# Patient Record
Sex: Female | Born: 1950 | Race: Black or African American | Hispanic: No | Marital: Single | Smoking: Never smoker
Health system: Southern US, Community
[De-identification: ages and names within clinical notes are randomized; demographics above are authoritative.]

## PROBLEM LIST (undated history)

## (undated) DIAGNOSIS — K649 Unspecified hemorrhoids: Secondary | ICD-10-CM

## (undated) DIAGNOSIS — E119 Type 2 diabetes mellitus without complications: Secondary | ICD-10-CM

## (undated) DIAGNOSIS — I10 Essential (primary) hypertension: Secondary | ICD-10-CM

## (undated) DIAGNOSIS — D649 Anemia, unspecified: Secondary | ICD-10-CM

## (undated) DIAGNOSIS — E7800 Pure hypercholesterolemia, unspecified: Secondary | ICD-10-CM

## (undated) DIAGNOSIS — N920 Excessive and frequent menstruation with regular cycle: Secondary | ICD-10-CM

## (undated) HISTORY — DX: Anemia, unspecified: D64.9

## (undated) HISTORY — DX: Excessive and frequent menstruation with regular cycle: N92.0

## (undated) HISTORY — DX: Unspecified hemorrhoids: K64.9

## (undated) HISTORY — DX: Pure hypercholesterolemia, unspecified: E78.00

## (undated) HISTORY — DX: Essential (primary) hypertension: I10

## (undated) HISTORY — DX: Type 2 diabetes mellitus without complications: E11.9

---

## 1992-06-29 HISTORY — PX: ECTOPIC PREGNANCY SURGERY: SHX613

## 1999-03-12 ENCOUNTER — Ambulatory Visit (HOSPITAL_COMMUNITY): Admission: RE | Admit: 1999-03-12 | Discharge: 1999-03-12 | Payer: Self-pay | Admitting: Internal Medicine

## 1999-03-12 ENCOUNTER — Encounter: Payer: Self-pay | Admitting: Internal Medicine

## 1999-08-05 ENCOUNTER — Encounter: Admission: RE | Admit: 1999-08-05 | Discharge: 1999-08-05 | Payer: Self-pay | Admitting: *Deleted

## 1999-08-05 ENCOUNTER — Encounter: Payer: Self-pay | Admitting: *Deleted

## 2001-01-25 ENCOUNTER — Encounter: Payer: Self-pay | Admitting: Internal Medicine

## 2001-01-25 ENCOUNTER — Encounter: Admission: RE | Admit: 2001-01-25 | Discharge: 2001-01-25 | Payer: Self-pay | Admitting: Internal Medicine

## 2002-03-09 ENCOUNTER — Encounter: Payer: Self-pay | Admitting: Internal Medicine

## 2002-03-09 ENCOUNTER — Encounter: Admission: RE | Admit: 2002-03-09 | Discharge: 2002-03-09 | Payer: Self-pay | Admitting: Internal Medicine

## 2002-08-22 ENCOUNTER — Other Ambulatory Visit: Admission: RE | Admit: 2002-08-22 | Discharge: 2002-08-22 | Payer: Self-pay | Admitting: Obstetrics and Gynecology

## 2003-06-07 ENCOUNTER — Encounter: Admission: RE | Admit: 2003-06-07 | Discharge: 2003-06-07 | Payer: Self-pay | Admitting: Internal Medicine

## 2003-11-07 ENCOUNTER — Other Ambulatory Visit: Admission: RE | Admit: 2003-11-07 | Discharge: 2003-11-07 | Payer: Self-pay | Admitting: Obstetrics and Gynecology

## 2003-12-20 ENCOUNTER — Encounter (INDEPENDENT_AMBULATORY_CARE_PROVIDER_SITE_OTHER): Payer: Self-pay | Admitting: *Deleted

## 2003-12-20 ENCOUNTER — Encounter (INDEPENDENT_AMBULATORY_CARE_PROVIDER_SITE_OTHER): Payer: Self-pay | Admitting: Specialist

## 2003-12-20 ENCOUNTER — Ambulatory Visit (HOSPITAL_COMMUNITY): Admission: RE | Admit: 2003-12-20 | Discharge: 2003-12-20 | Payer: Self-pay | Admitting: Gastroenterology

## 2004-09-17 ENCOUNTER — Encounter: Admission: RE | Admit: 2004-09-17 | Discharge: 2004-09-17 | Payer: Self-pay | Admitting: Internal Medicine

## 2004-10-22 ENCOUNTER — Encounter: Admission: RE | Admit: 2004-10-22 | Discharge: 2004-10-22 | Payer: Self-pay | Admitting: Internal Medicine

## 2005-06-17 ENCOUNTER — Other Ambulatory Visit: Admission: RE | Admit: 2005-06-17 | Discharge: 2005-06-17 | Payer: Self-pay | Admitting: Obstetrics and Gynecology

## 2005-11-20 ENCOUNTER — Encounter: Admission: RE | Admit: 2005-11-20 | Discharge: 2005-11-20 | Payer: Self-pay | Admitting: Internal Medicine

## 2006-04-29 HISTORY — PX: ENDOMETRIAL BIOPSY: SHX622

## 2006-06-29 DIAGNOSIS — N920 Excessive and frequent menstruation with regular cycle: Secondary | ICD-10-CM

## 2006-06-29 HISTORY — DX: Excessive and frequent menstruation with regular cycle: N92.0

## 2006-10-13 ENCOUNTER — Encounter: Admission: RE | Admit: 2006-10-13 | Discharge: 2007-01-11 | Payer: Self-pay | Admitting: Obstetrics and Gynecology

## 2006-11-26 ENCOUNTER — Other Ambulatory Visit: Admission: RE | Admit: 2006-11-26 | Discharge: 2006-11-26 | Payer: Self-pay | Admitting: Obstetrics and Gynecology

## 2006-12-30 ENCOUNTER — Encounter: Admission: RE | Admit: 2006-12-30 | Discharge: 2006-12-30 | Payer: Self-pay | Admitting: Obstetrics and Gynecology

## 2007-12-02 ENCOUNTER — Other Ambulatory Visit: Admission: RE | Admit: 2007-12-02 | Discharge: 2007-12-02 | Payer: Self-pay | Admitting: Obstetrics and Gynecology

## 2008-01-02 ENCOUNTER — Encounter: Admission: RE | Admit: 2008-01-02 | Discharge: 2008-01-02 | Payer: Self-pay | Admitting: Obstetrics and Gynecology

## 2008-11-02 ENCOUNTER — Observation Stay (HOSPITAL_COMMUNITY): Admission: EM | Admit: 2008-11-02 | Discharge: 2008-11-05 | Payer: Self-pay | Admitting: Emergency Medicine

## 2008-11-05 ENCOUNTER — Encounter (INDEPENDENT_AMBULATORY_CARE_PROVIDER_SITE_OTHER): Payer: Self-pay | Admitting: Internal Medicine

## 2009-01-02 ENCOUNTER — Encounter: Admission: RE | Admit: 2009-01-02 | Discharge: 2009-01-02 | Payer: Self-pay | Admitting: Obstetrics and Gynecology

## 2009-03-26 DIAGNOSIS — Z860100 Personal history of colon polyps, unspecified: Secondary | ICD-10-CM | POA: Insufficient documentation

## 2009-03-26 DIAGNOSIS — I10 Essential (primary) hypertension: Secondary | ICD-10-CM | POA: Insufficient documentation

## 2009-03-26 DIAGNOSIS — E785 Hyperlipidemia, unspecified: Secondary | ICD-10-CM

## 2009-03-26 DIAGNOSIS — Z862 Personal history of diseases of the blood and blood-forming organs and certain disorders involving the immune mechanism: Secondary | ICD-10-CM

## 2009-07-10 ENCOUNTER — Telehealth: Payer: Self-pay | Admitting: Internal Medicine

## 2009-07-10 ENCOUNTER — Encounter: Payer: Self-pay | Admitting: Internal Medicine

## 2009-08-07 ENCOUNTER — Ambulatory Visit: Payer: Self-pay | Admitting: Internal Medicine

## 2009-08-07 DIAGNOSIS — K648 Other hemorrhoids: Secondary | ICD-10-CM | POA: Insufficient documentation

## 2010-01-07 ENCOUNTER — Encounter: Admission: RE | Admit: 2010-01-07 | Discharge: 2010-01-07 | Payer: Self-pay | Admitting: Obstetrics and Gynecology

## 2010-07-29 NOTE — Assessment & Plan Note (Signed)
Summary: RECTAL PAIN/ITCHING            [name]   History of Present Illness Visit Type: Initial Visit Primary GI MD: Lina Sar MD Primary Provider: Knox Royalty, MD Chief Complaint: Hemorrhoid flare in Jan. 2011, not irritated  now History of Present Illness:   This is a 60 year old African American female with symptomatic hemorrhoids. She complains of rectal pain and irritation. The last episode was several weeks ago and a previous episode occurred in November 2010. She denies rectal bleeding or any prolapse. Problems started last year. She is quite overweight but denies any change in bowel habits. She denies constipation or diarrhea. Other medical problems include high blood pressure and hyperlipidemia.  Colonoscopy and upper endoscopy was  in June 2005 by Dr.Ganem  showed a small hyperplastic polyp but was an otherwise normal exam. She was at that time evaluated for iron deficiency anemia.   GI Review of Systems      Denies abdominal pain, acid reflux, belching, bloating, chest pain, dysphagia with liquids, dysphagia with solids, heartburn, loss of appetite, nausea, vomiting, vomiting blood, weight loss, and  weight gain.      Reports hemorrhoids and  rectal pain.     Denies anal fissure, black tarry stools, change in bowel habit, constipation, diarrhea, diverticulosis, fecal incontinence, heme positive stool, irritable bowel syndrome, jaundice, light color stool, liver problems, and  rectal bleeding. Preventive Screening-Counseling & Management  Alcohol-Tobacco     Smoking Status: never    Current Medications (verified): 1)  Atenolol 100 Mg Tabs (Atenolol) .... Once Daily 2)  Micardis Hct 80-25 Mg Tabs (Telmisartan-Hctz) .... Once Daily 3)  Simvastatin 20 Mg Tabs (Simvastatin) .... Once Daily 4)  Lovaza 1 Gm Caps (Omega-3-Acid Ethyl Esters) .... Take 4 Capsules Daily 5)  Aspir-Low 81 Mg Tbec (Aspirin) .... Once Daily  Allergies (verified): No Known Drug Allergies  Past  History:  Past Medical History: Current Problems:  DYSLIPIDEMIA (ICD-272.4) HYPERTENSION (ICD-401.9) IRON DEFICIENCY ANEMIA, HX OF (ICD-V12.3) COLONIC POLYPS, HYPERPLASTIC, HX OF (ICD-V12.72)   Obesity  Past Surgical History: Etopic pregnancy 1994  Family History: Reviewed history from 03/26/2009 and no changes required. No FH of Colon Cancer:  Social History: Illicit Drug Use - no Occupation: Retired Patient has never smoked.  Alcohol Use - no Daily Caffeine Use Smoking Status:  never  Review of Systems  The patient denies allergy/sinus, anemia, anxiety-new, arthritis/joint pain, back pain, blood in urine, breast changes/lumps, change in vision, confusion, cough, coughing up blood, depression-new, fainting, fatigue, fever, headaches-new, hearing problems, heart murmur, heart rhythm changes, itching, menstrual pain, muscle pains/cramps, night sweats, nosebleeds, pregnancy symptoms, shortness of breath, skin rash, sleeping problems, sore throat, swelling of feet/legs, swollen lymph glands, thirst - excessive , urination - excessive , urination changes/pain, urine leakage, vision changes, and voice change.         Pertinent positive and negative review of systems were noted in the above HPI. All other ROS was otherwise negative.   Vital Signs:  Patient profile:   60 year old female Height:      61 inches Weight:      250.50 pounds BMI:     47.50 Pulse rate:   72 / minute Pulse rhythm:   regular BP sitting:   110 / 68  (left arm) Cuff size:   regular  Vitals Entered By: June McMurray CMA Duncan Dull) (August 07, 2009 9:11 AM)  Physical Exam  General:  alert, oriented, overweight. Neck:  Supple;  no masses or thyromegaly. Lungs:  Clear throughout to auscultation. Heart:  Regular rate and rhythm; no murmurs, rubs,  or bruits. Abdomen:  obese soft abdomen, nontender. Normoactive bowel sounds. No distention. Rectal:  rectal and anoscopic exam reveals normal perianal area, no  prolapsed tissue. Normal rectal sphincter. First grade internal hemorrhoids with hyperemia and tiny fissures on top of.  No active bleeding. Mucosa appears normal; no evidence of colitis. There is no evidence of thrombosis. Stool is Hemoccult negative. Extremities:  No clubbing, cyanosis, edema or deformities noted. Skin:  Intact without significant lesions or rashes. Psych:  Alert and cooperative. Normal mood and affect.   Impression & Recommendations:  Problem # 1:  HEMORRHOIDS, INTERNAL (ICD-455.0) Patient has symptomatic first-degree internal hemorrhoids. There is no prolapse or other complications. The hems are only  mildly active  on today's exam. Patient has episodic flareups. Because of  small  size  I would not recommend surgical intervention. We have discussed at length  a high-fiber diet and  topical steroids as well as Anusol-HC suppositories. I advised her to take the suppositories as a maintenance maybe once a week and use the Analpram cream preventively about once a week. If the flareups continue, I would suggest band ligation of the internal hemorrhoids. She suggested PPH treatment but I do not feel that the hemorrhoids are not large enough for this. She also saw an advertisment in the newspaper by [facility] for treatment of hemorrhoids. I encouraged her to find out the nature of the treatment.  Problem # 2:  COLONIC POLYPS, HYPERPLASTIC, HX OF (ICD-V12.72) Patient's last colonoscopy was in 2005. Her next colonoscopy will be due in June 2015.  Patient Instructions: 1)  Analpram cream 2.5% apply 3 times a day p.r.n. flareup hemorrhoids and as a maintenance once a week. 2)  Anusol-HC suppositories q.h.s. for flareup and for maintenance once a week. 3)  High-fiber diet. 4)  Information booklet on hemorrhoids. 5)  Return in one year to reassess. 6)  Recall colonoscopy June 2015. 7)  Copy sent to : Dr Drucilla Chalet 8)  The medication list was reviewed and reconciled.  All changed / newly  prescribed medications were explained.  A complete medication list was provided to the patient / caregiver. Prescriptions: ANALPRAM-HC 1-2.5 % CREA (HYDROCORTISONE ACE-PRAMOXINE) Apply to rectum 3 times daily as needed for hemorrhoids  #30 grams x 3   Entered by:   Hortense Ramal CMA (AAMA)   Authorized by:   Hart Carwin MD   Signed by:   Hortense Ramal CMA (AAMA) on 08/07/2009   Method used:   Electronically to        [id number] (retail)       [address]       [place], [place]  [address]       Ph: [phone] or [phone]       Fax: [phone]   RxID:   [id number] ANUSOL-HC 25 MG SUPP (HYDROCORTISONE ACETATE) Insert 1 suppository into rectum at bedtime and as needed for flareups  #12 x 3   Entered by:   Hortense Ramal CMA (AAMA)   Authorized by:   Hart Carwin MD   Signed by:   Hortense Ramal CMA (AAMA) on 08/07/2009   Method used:   Electronically to        [id number] (retail)       [address]       [place], [place]  [address]  Ph: [phone] or [phone]       Fax: [phone]   RxID:   [id number]

## 2010-07-29 NOTE — Letter (Signed)
Summary: New Patient letter  [facility]  [address] [place], [place] [address]   Phone: [phone]  Fax: [phone]       07/10/2009 MRN: [id number]  [facility] [address] [place], [place]  [address]  Dear Ms. Riemenschneider,  Welcome to the Gastroenterology Division at [facility]    You are scheduled to see Dr. Lina Sar on 08-07-09 at 9am, on the 3rd floor at [facility], [address]  We ask that you try to arrive at our office 15 minutes prior to your appointment time to allow for check-in.  We would like you to complete the enclosed self-administered evaluation form prior to your visit and bring it with you on the day of your appointment.  We will review it with you.  Also, please bring a complete list of all your medications or, if you prefer, bring the medication bottles and we will list them.  Please bring your insurance card so that we may make a copy of it.  If your insurance requires a referral to see a specialist, please bring your referral form from your primary care physician.  Co-payments are due at the time of your visit and may be paid by cash, check or credit card.     Your office visit will consist of a consult with your physician (includes a physical exam), any laboratory testing he/she may order, scheduling of any necessary diagnostic testing (e.g. x-ray, ultrasound, CT-scan), and scheduling of a procedure (e.g. Endoscopy, Colonoscopy) if required.  Please allow enough time on your schedule to allow for any/all of these possibilities.    If you cannot keep your appointment, please call [phone] to cancel or reschedule prior to your appointment date.  This allows [place] the opportunity to schedule an appointment for another patient in need of care.  If you do not cancel or reschedule by 5 p.m. the business day prior to your appointment date, you will be charged a $50.00 late cancellation/no-show fee.    Thank you for choosing  [facility] for your medical needs.  We appreciate the opportunity to care for you.  Please visit [place] at our website  to learn more about our practice.                     Sincerely,                                                             The Gastroenterology Division   Appended Document: New Patient letter Letter mailed to patient.

## 2010-07-29 NOTE — Progress Notes (Signed)
Summary: triage  Phone Note Call from Patient Call back at Work Phone [phone]   Caller: Patient Call For: Dr. Juanda Chance Reason for Call: Talk to Nurse Summary of Call: pt called to sch appt for hemorrhoids.Marland Kitchenonly wants to see a female. Initial call taken by: Vallarie Mare,  July 10, 2009 11:52 AM  Follow-up for Phone Call        Pt. has GI  history w/Dr.Ganim in 2005. Pt. wants to establish with  a female MD.  Pt. c/o hemorrhoids, she was seen by her PCP and was given some cream, pt. continues w/episodes of rectal pain/itching. Denies blood. 1) See Dr.Inara Dike on 08-07-09 at 9am 2) Continue treatment with PCP until then. Follow-up by: Laureen Ochs LPN,  July 10, 2009 12:07 PM

## 2010-10-07 LAB — COMPREHENSIVE METABOLIC PANEL
ALT: 15 U/L (ref 0–35)
AST: 19 U/L (ref 0–37)
Albumin: 3.2 g/dL — ABNORMAL LOW (ref 3.5–5.2)
Alkaline Phosphatase: 65 U/L (ref 39–117)
Alkaline Phosphatase: 80 U/L (ref 39–117)
BUN: 21 mg/dL (ref 6–23)
CO2: 26 mEq/L (ref 19–32)
Calcium: 9.6 mg/dL (ref 8.4–10.5)
Calcium: 9.7 mg/dL (ref 8.4–10.5)
Creatinine, Ser: 1.03 mg/dL (ref 0.4–1.2)
GFR calc Af Amer: >60 mL/min (ref >60)
GFR calc non Af Amer: 56 mL/min — ABNORMAL LOW (ref >60)
Glucose, Bld: 120 mg/dL — ABNORMAL HIGH (ref 70–99)
Glucose, Bld: 92 mg/dL (ref 70–99)
Potassium: 3.7 mEq/L (ref 3.5–5.1)
Sodium: 138 mEq/L (ref 135–145)
Total Protein: 6.6 g/dL (ref 6.0–8.3)

## 2010-10-07 LAB — CULTURE, BLOOD (ROUTINE X 2)
Culture: NO GROWTH
Culture: NO GROWTH

## 2010-10-07 LAB — CBC
Hemoglobin: 12.3 g/dL (ref 12.0–15.0)
MCHC: 33 g/dL (ref 30.0–36.0)
MCHC: 33.2 g/dL (ref 30.0–36.0)
MCV: 87.1 fL (ref 78.0–100.0)
Platelets: 261 10*3/uL (ref 150–400)
Platelets: 277 10*3/uL (ref 150–400)
RBC: 4.68 MIL/uL (ref 3.87–5.11)
RDW: 14.8 % (ref 11.5–15.5)
RDW: 15 % (ref 11.5–15.5)
WBC: 8.4 10*3/uL (ref 4.0–10.5)

## 2010-10-07 LAB — PROTIME-INR: Prothrombin Time: 14 seconds (ref 11.6–15.2)

## 2010-10-07 LAB — APTT: aPTT: 27 seconds (ref 24–37)

## 2010-10-07 LAB — URINE CULTURE

## 2010-10-07 LAB — LIPID PANEL
Cholesterol: 168 mg/dL (ref 0–200)
HDL: 38 mg/dL — ABNORMAL LOW (ref >39)
Total CHOL/HDL Ratio: 4.4 RATIO
VLDL: 17 mg/dL (ref 0–40)

## 2010-10-07 LAB — DIFFERENTIAL
Basophils Absolute: 0.1 10*3/uL (ref 0.0–0.1)
Basophils Relative: 1 % (ref 0–1)
Eosinophils Absolute: 0 10*3/uL (ref 0.0–0.7)
Lymphocytes Relative: 19 % (ref 12–46)
Lymphs Abs: 1.6 10*3/uL (ref 0.7–4.0)
Monocytes Absolute: 0.5 10*3/uL (ref 0.1–1.0)

## 2010-10-07 LAB — CK TOTAL AND CKMB (NOT AT ARMC): Total CK: 259 U/L — ABNORMAL HIGH (ref 7–177)

## 2010-10-07 LAB — URINALYSIS, ROUTINE W REFLEX MICROSCOPIC
Glucose, UA: NEGATIVE mg/dL
Ketones, ur: NEGATIVE mg/dL
Urobilinogen, UA: 1 mg/dL (ref 0.0–1.0)

## 2010-10-07 LAB — POCT CARDIAC MARKERS: CKMB, poc: 2 ng/mL (ref 1.0–8.0)

## 2010-10-07 LAB — URINE MICROSCOPIC-ADD ON

## 2010-10-07 LAB — CARDIAC PANEL(CRET KIN+CKTOT+MB+TROPI)
CK, MB: 2.3 ng/mL (ref 0.3–4.0)
Total CK: 186 U/L — ABNORMAL HIGH (ref 7–177)

## 2010-10-07 LAB — HEMOGLOBIN A1C: Mean Plasma Glucose: 143 mg/dL

## 2010-11-11 NOTE — H&P (Signed)
NAMECHARICE, [name]               ACCOUNT NO.:  [id number]   MEDICAL RECORD NO.:  [id number]          PATIENT TYPE:  EMS   LOCATION:  MAJO                         FACILITY:  [facility]   PHYSICIAN:  Richarda Overlie, MD       DATE OF BIRTH:  [birth year]   DATE OF ADMISSION:  11/01/2008  DATE OF DISCHARGE:                              HISTORY & PHYSICAL   SUBJECTIVE:  This is a 60 year old female who presents to the ER after  an episode of sweating, diaphoresis, chest tightness and one episode of  vomiting that started around 4 p.m. yesterday evening.  The patient's  symptoms of fairly acute onset lasted for about 5 minutes.  She  subsequently presented to [facility] at 7 p.m. where she was told  to go to the ER for further workup.  In the ER the patient's EKG was  essentially negative and the patient, because of her risk factor of high  blood pressure and high cholesterol, was sent to the [facility] ER for  further evaluation.  By the time she reached the ER, she was chest pain  free and the EKG was negative.  She had a couple of troponins that were  negative.  She is being admitted as per patient request for further  cardiac workup.   PAST MEDICAL HISTORY:  1. Hypertension.  2. Dyslipidemia.   SOCIAL HISTORY:  Nonsmoker, nonalcoholic, no drug use.   FAMILY HISTORY:  Family history positive for hypertension in the mother  and CVA in the grandmother.   HOME MEDICATIONS:  Atenolol, Lovasa, Cardizem was started, dosages  unknown.   REVIEW OF SYSTEMS:  Ten-point review of systems was found to be negative  for fever, chills and rigors, cough, palpitations, orthopnea, paroxysmal  nocturnal dyspnea, peripheral edema, urinary urgency, frequency,  dysuria, diarrhea, constipation, blood in stool, black tarry stool, mild  degree of arthralgias or skin rash, headache, blurry vision, unilateral  weakness, dysarthria or slurred speech.   PHYSICAL EXAMINATION:  VITAL SIGNS:  Blood pressure  113/74,  pulse 71,  respirations 13, temperature 98.2.  GENERAL:  The patient appears to be comfortable, in no acute distress.  HEENT:  Pupils equal and reactive.  Extraocular movements intact.  NECK:  Supple without any JVD, respirations.  CARDIOVASCULAR:  Regular.  LUNGS:  Clear to auscultation bilaterally.  No wheezes or crackles or  rhonchi.  ABDOMEN:  Soft, nontender, nondistended.  EXTREMITIES:  Without cyanosis, clubbing or edema.  NEUROLOGIC:  Cranial nerves II-XII grossly intact.  PSYCHIATRY:  Appropriate mood and affect.   LABORATORY DATA:  Her EKG shows normal sinus rhythm with 65 beats per  minute.  Urinalysis negative.  Troponin less than 0.05, INR 1.1.  Sodium  137, potassium 3, chloride 101, bicarb 26, glucose 120, BUN 21,  creatinine 1.03, calcium 9.6, albumin 3.6, AST 23, ALT 17, T bili of  0.7.  WBC 8.4, hemoglobin 13.5, was 40.7, MCV 87.1, platelets 277,000.  Chest x-ray:  Low lung volumes with minimal bibasilar atelectasis.   ASSESSMENT/PLAN:  Patient has 2 cardiovascular risk factors,  hypertension and dyslipidemia, and  is also morbidly obese.  The  patient's symptoms could have been consistent with angina, but she does  not present with any history of unstable angina.  No previous history of  coronary artery disease.  She had a previous stress test in 2003 that  was negative.  The patient is quite anxious to go back home and is  requesting further cardiac workup.  Cardiology consultation will be  obtained in the morning to see if the patient is a candidate for stress  test versus an invasive cardiac work-up.  Will obtain a fasting lipid  panel in the morning.  Continue her atenolol.  Also start her on low-  dose aspirin at 81 mg p.o. daily.  Lovenox for DVT prophylaxis, n.p.o.  past midnight pending cardiology evaluation.  She is a full code and  should be monitored on telemetry unit.      Richarda Overlie, MD  Electronically Signed     NA/MEDQ  D:   11/02/2008  T:  11/02/2008  Job:  [id number]

## 2010-11-11 NOTE — Discharge Summary (Signed)
[name], [name]               ACCOUNT NO.:  [id number]   MEDICAL RECORD NO.:  [id number]           PATIENT TYPE:   LOCATION:                                 FACILITY:   PHYSICIAN:  Beckey Rutter, MD  DATE OF BIRTH:  10/06/1950   DATE OF ADMISSION:  DATE OF DISCHARGE:                               DISCHARGE SUMMARY   CHIEF COMPLAINTS:  Chest pain.   HOSPITAL PROCEDURES:  1. Chest x-ray on Nov 01, 2008.  Impression is no acute cardiopulmonary      disease.  2. Low lung volumes with minimal bibasilar atelectasis.   LABORATORY TEST:  Urine culture growing more than 100,000 colonies of  multiple organisms suggesting inappropriate collection.  A1c 6.6 and TSH  is 2.293.  Blood cultures are negative.  Creatinine is 0.91.  White  blood count is 6.9, hemoglobin is 12.3, hematocrit 37.1, platelet count  is 261.   HOSPITAL COURSE:  1. Chest pain:  The patient was ruled out for acute coronary syndrome      by serial cardiac enzymes and serial EKG tracings.  The patient had      2-D echo done.  The result is still pending.  Nevertheless, the      patient feels much better and she does not want to wait for the      result of the echo.  I had a lengthy discussion with her in regards      to the result of the echo, and she wanted to come and collect the      result herself, which is agreeable to me.  Hence, clinically she is      stable for discharge.  2. Slight elevation in A1c, which could indicate diabetes type 2:  The      patient wanted to follow up with her primary physician for further      assessment.  3. Obesity, likely resulting in hyperglycemia as mentioned above.  The      patient was counseled.  4. I referred [name] to [facility].  The patient had a stress      test 8 years ago, which was negative as per her.  The phone number      was given, and she will follow up for further cardiac      stratification.  The patient might benefit from monitoring of her  glucose and possible biguanides medication.  I would leave these      medications and diagnosis of diabetes for her primary physician.   DISCHARGE DIAGNOSES:  1. Hypertension.  2. Dyslipidemia.  3. Urinary tract infection.  4. Obesity.  5. 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   DISCHARGE MEDICATIONS:  1. Atenolol 50 mg p.o. daily.  2. Lovaza 4 g p.o. daily.  3. Micardis daily dose.  4. Zocor 20 mg daily.  5. Aspirin 81 mg daily.  6. Amoxicillin 500 mg p.o. t.i.d. for 3 more days.   The patient is stable for discharge.  She does not want to wait for 2-D  echo.  Nevertheless, the patient will be  referred to Cardiology as  discussed above.  The patient wanted to follow up with her primary  physician for further management of her hypertension and borderline  diabetes.  She is aware and agreeable to discharge plan.      Beckey Rutter, MD  Electronically Signed     EME/MEDQ  D:  11/05/2008  T:  11/06/2008  Job:  [id number]

## 2010-11-14 NOTE — Op Note (Signed)
[name], [name]                           ACCOUNT NO.:  [id number]   MEDICAL RECORD NO.:  [id number]                   PATIENT TYPE:  AMB   LOCATION:  ENDO                                 FACILITY:  [facility]   PHYSICIAN:  Graylin Shiver, M.D.                DATE OF BIRTH:  1951-03-20   DATE OF PROCEDURE:  12/20/2003  DATE OF DISCHARGE:                                 OPERATIVE REPORT   PROCEDURE:  Colonoscopy with biopsy.   INDICATIONS FOR PROCEDURE:  Iron deficiency anemia, rule out colon lesion.  Informed consent was obtained after explanation of the risks of bleeding,  infection, and perforation.   PREMEDICATION:  The procedure was done immediately after an EGD with an  additional 15 mcg of Fentanyl and 1.5 mg Versed.   PROCEDURE IN DETAIL:  With the patient in the left lateral decubitus  position, a rectal exam was performed and no masses were felt.  The Olympus  colonoscope was inserted into the rectum and advanced around the colon to  the cecum.  Cecal landmarks were identified.  The cecum  and ascending colon  were normal.  The transverse colon was normal.  The descending colon and  sigmoid revealed some scattered diverticula.  In the rectum, there were two  tiny polyps which were biopsied with cold forceps.  She tolerated the  procedure well without complications.   IMPRESSION:  1. Diverticulosis.  2. Two tiny rectal polyps.   PLAN:  The pathology will be checked.  I see nothing on this examination to  explain the iron deficiency anemia.  The patient will follow up with Dr.  Julieanne Manson for further evaluation and treatment.                                               Graylin Shiver, M.D.    [name]  D:  12/20/2003  T:  12/21/2003  Job:  [id number]   cc:   Marcene Duos, M.D.  [address]  [place]  [place] [address]  Fax: [phone]

## 2010-11-14 NOTE — Op Note (Signed)
[name], GRANT                           ACCOUNT NO.:  [id number]   MEDICAL RECORD NO.:  [id number]                   PATIENT TYPE:  AMB   LOCATION:  ENDO                                 FACILITY:  [facility]   PHYSICIAN:  Graylin Shiver, M.D.                DATE OF BIRTH:  22-Nov-1950   DATE OF PROCEDURE:  12/20/2003  DATE OF DISCHARGE:                                 OPERATIVE REPORT   PROCEDURE:  Esophagogastroduodenoscopy.   INDICATIONS FOR PROCEDURE:  Iron deficiency anemia, rule out upper GI  lesion.  Informed consent was obtained after explanation of the risks of  bleeding, infection, and perforation.   PREMEDICATION:  Fentanyl 60 mcg IV, Versed 6 mg IV.   PROCEDURE IN DETAIL:  With the patient in the left lateral decubitus  position, the Olympus gastroscope was inserted into the oropharynx and  passed into the esophagus.  It was advanced down the esophagus, into the  stomach, and into the duodenum.  The second portion of the bulb and duodenum  were normal.  The stomach looked normal in its entirety including the upper  fundus and cardia seen on retroflexion.  The esophagus looked normal in its  entirety.  The esophagogastric junction was at 35 cm.  She tolerated the  procedure well without complications.   IMPRESSION:  Normal esophagogastroduodenoscopy.   PLAN:  I see nothing on this examination to explain her iron deficiency  anemia.                                               Graylin Shiver, M.D.    Germain Osgood  D:  12/20/2003  T:  12/21/2003  Job:  [id number]   cc:   Marcene Duos, M.D.  [address]  [place]  [place] [address]  Fax: [phone]

## 2011-01-08 ENCOUNTER — Other Ambulatory Visit: Payer: Self-pay | Admitting: Obstetrics and Gynecology

## 2011-01-08 DIAGNOSIS — Z1231 Encounter for screening mammogram for malignant neoplasm of breast: Secondary | ICD-10-CM

## 2011-01-15 ENCOUNTER — Ambulatory Visit
Admission: RE | Admit: 2011-01-15 | Discharge: 2011-01-15 | Disposition: A | Discharge status: Home / Self Care | Payer: Federal, State, Local not specified - PPO | Source: Ambulatory Visit | Attending: Obstetrics and Gynecology | Admitting: Obstetrics and Gynecology

## 2011-01-15 DIAGNOSIS — Z1231 Encounter for screening mammogram for malignant neoplasm of breast: Secondary | ICD-10-CM

## 2011-02-16 ENCOUNTER — Encounter: Payer: Federal, State, Local not specified - PPO | Attending: Family Medicine | Admitting: *Deleted

## 2011-02-16 DIAGNOSIS — Z713 Dietary counseling and surveillance: Secondary | ICD-10-CM | POA: Insufficient documentation

## 2011-02-16 DIAGNOSIS — E119 Type 2 diabetes mellitus without complications: Secondary | ICD-10-CM | POA: Insufficient documentation

## 2011-02-17 ENCOUNTER — Encounter: Payer: Self-pay | Admitting: *Deleted

## 2011-02-17 NOTE — Patient Instructions (Signed)
Patient will attend Core Diabetes Courses as scheduled or follow up prn.  

## 2011-02-17 NOTE — Progress Notes (Signed)
  Patient was seen on 02/16/11 for the first of a series of three diabetes self-management courses at [facility]. The following learning objectives were met by the patient during this course:   Defines diabetes and the role of insulin  Identifies type of diabetes and pathophysiology  States normal BG range and personal goals  Identifies three risk factors for the development of diabetes  States the need for and frequency of healthcare follow up (ADA Standards of Care)   Patient has established the following initial goals:  Increase exercise  Lose weight  Follow diabetes meal plan  Follow-Up Plan: Core Diabetes Classes @ [facility]

## 2011-02-18 ENCOUNTER — Ambulatory Visit: Payer: Federal, State, Local not specified - PPO

## 2011-03-10 ENCOUNTER — Encounter: Payer: Federal, State, Local not specified - PPO | Attending: Family Medicine

## 2011-03-10 DIAGNOSIS — E119 Type 2 diabetes mellitus without complications: Secondary | ICD-10-CM | POA: Insufficient documentation

## 2011-03-10 DIAGNOSIS — Z713 Dietary counseling and surveillance: Secondary | ICD-10-CM | POA: Insufficient documentation

## 2011-03-11 NOTE — Progress Notes (Signed)
  Patient was seen on 03/10/11 for the second of a series of three diabetes self-management courses at the [facility]. The following learning objectives were met by the patient during this course:   States the relationship of exercise to blood glucose  States benefits/barriers of regular and safe exercise  States three guidelines for safe and effective exercise  Describes personal diabetes medicine regimen  Describes actions of own medications  Describes causes, symptoms, and treatment of hypo/hyperglycemia  Describes sick day rules  Identifies when to test urine for ketones when appropriate  Identifies when to call healthcare provider for acute complications  States the risk for problems with foot, skin, and dental care  States preventative foot, skin, and dental care measures  States when to call healthcare provider regarding foot, skin, and dental care  Identifies methods for evaluation of diabetes control  Discusses benefits of SBGM  Identifies relationship between nutrition, exercise, medication, and glucose levels  Discusses the importance of record keeping  *Patient received [facility] Core Program Notebook at class.  Follow-Up Plan: Patient will attend the final class of the ADA Diabetes Self-Care Education.   

## 2011-03-17 ENCOUNTER — Encounter: Payer: Federal, State, Local not specified - PPO | Admitting: Dietician

## 2011-03-18 NOTE — Progress Notes (Signed)
  Patient was seen on 03/17/2011 for the third of a series of three diabetes self-management courses at the [facility]. The following learning objectives were met by the patient during this course:   Identifies nutrient effects on glycemia  States the general guidelines of meal planning  Relates understanding of personal meal plan  Describes situations that cause stress and discuss methods of stress management  Identifies lifestyle behaviors for change  The following handouts were given in class:  [employer] Carbohydrate Counting book  3 Month Follow Up Visit handout  Goal setting handout  Class evaluation form  Your patient has established the following 3 month goals for diabetes self-care:  Eat meals on time; breakfast, lunch and dinner.  Eat 5 servings of fruits/vegetables each day.  Follow-Up Plan: Patient will attend a 3 month follow-up visit for diabetes self-management education.

## 2011-06-26 ENCOUNTER — Encounter: Payer: Federal, State, Local not specified - PPO | Attending: Family Medicine | Admitting: Dietician

## 2011-06-26 ENCOUNTER — Encounter: Payer: Self-pay | Admitting: Dietician

## 2011-06-26 NOTE — Progress Notes (Signed)
  Patient was seen on 06/26/2011 for their 3 month follow-up as a part of the diabetes self-management courses at the [facility]. The following learning objectives were met by your patient during this course:  Please see Diabetes Flow sheet for findings related to patient's self-care.  Patient self reports the following:  Diabetes control has improved since diabetes self-management training: Unsure, she is not checking her blood glucose levels. Number of days blood glucose is >200: Not monitoring Last MD appointment for diabetes: Due 06/26/2011 (today) She is to call me the results of her HgA1C. Changes in treatment plan: None so far at this time. Confidence with ability to manage diabetes: Notes she is trying to manage her diabetes.  She has tried to eat more fruits and vegetables, to eat more regular meals.  Still skipping meals.  Has lost 8.1 lb since she started the classes.  She had an issue with oral surgery back in October.  Gums have healed and she is willing to do daily oral care. Areas for improvement with diabetes self-care: Needs to try to eat something at breakfast.  Needs to begin a regular activity routine such as walking. Willingness to participate in diabetes support group: Unsure.   Follow-Up Plan: Patient is eligible for a "free" 30 minute diabetes self-care appointment in the next year. Patient to call and schedule as needed.

## 2012-01-01 ENCOUNTER — Other Ambulatory Visit: Payer: Self-pay | Admitting: Obstetrics and Gynecology

## 2012-01-01 DIAGNOSIS — Z1231 Encounter for screening mammogram for malignant neoplasm of breast: Secondary | ICD-10-CM

## 2012-01-27 ENCOUNTER — Ambulatory Visit
Admission: RE | Admit: 2012-01-27 | Discharge: 2012-01-27 | Disposition: A | Discharge status: Home / Self Care | Payer: Federal, State, Local not specified - PPO | Source: Ambulatory Visit | Attending: Obstetrics and Gynecology | Admitting: Obstetrics and Gynecology

## 2012-01-27 DIAGNOSIS — Z1231 Encounter for screening mammogram for malignant neoplasm of breast: Secondary | ICD-10-CM

## 2012-12-21 ENCOUNTER — Other Ambulatory Visit: Payer: Self-pay

## 2012-12-21 DIAGNOSIS — Z1231 Encounter for screening mammogram for malignant neoplasm of breast: Secondary | ICD-10-CM

## 2013-01-05 ENCOUNTER — Encounter: Payer: Self-pay | Admitting: Obstetrics and Gynecology

## 2013-01-06 ENCOUNTER — Encounter: Payer: Self-pay | Admitting: Obstetrics and Gynecology

## 2013-01-06 ENCOUNTER — Ambulatory Visit (INDEPENDENT_AMBULATORY_CARE_PROVIDER_SITE_OTHER): Payer: Federal, State, Local not specified - PPO | Admitting: Obstetrics and Gynecology

## 2013-01-06 VITALS — BP 110/68 | HR 72 | Resp 16 | Ht 63.0 in | Wt 243.0 lb

## 2013-01-06 DIAGNOSIS — Z01419 Encounter for gynecological examination (general) (routine) without abnormal findings: Secondary | ICD-10-CM

## 2013-01-06 NOTE — Progress Notes (Signed)
Patient ID: [name], female   DOB: [birth year], 62 y.o.   MRN: [id number] 62 y.o.   Single    Caucasian   female   [phone]   here for annual exam.  No vag bleeding.    No LMP recorded. Patient is postmenopausal.          Sexually active: no  The current method of family planning is none.    Exercising: "a teeny bit" Last mammogram:  01/27/12 Last pap smear: 12/11/09 History of abnormal pap: none  Smoking: never Alcohol: never Last colonoscopy: 2005 Last Bone Density:  2009, osteopenia Last tetanus shot: 2008 Last cholesterol check: 06/2012   Hgb:   PCP         Urine:   PCP   Family History  Problem Relation Age of Onset  . Hypertension Mother   . Hypertension Sister   . Hypertension Maternal Grandmother     Patient Active Problem List   Diagnosis Date Noted  . HEMORRHOIDS, INTERNAL 08/07/2009  . DYSLIPIDEMIA 03/26/2009  . HYPERTENSION 03/26/2009  . IRON DEFICIENCY ANEMIA, HX OF 03/26/2009  . COLONIC POLYPS, HYPERPLASTIC, HX OF 03/26/2009    Past Medical History  Diagnosis Date  . Hypertension     chronic  . Menorrhagia 2008    6mon Lupron Tx completed 09/2006  . Elevated cholesterol     Past Surgical History  Procedure Laterality Date  . Ectopic pregnancy surgery  1994    Bilat Salping  . Endometrial biopsy  04/2006    Allergies: Review of patient's allergies indicates no known allergies.  Current Outpatient Prescriptions  Medication Sig Dispense Refill  . atenolol (TENORMIN) 100 MG tablet Take 100 mg by mouth daily.        . Multiple Vitamin (MULTIVITAMIN) tablet Take 1 tablet by mouth daily.      Marland Kitchen omega-3 acid ethyl esters (LOVAZA) 1 G capsule Take 2 g by mouth 2 (two) times daily.        . simvastatin (ZOCOR) 20 MG tablet Take 20 mg by mouth at bedtime.        Marland Kitchen telmisartan-hydrochlorothiazide (MICARDIS HCT) 80-25 MG per tablet Take 1 tablet by mouth daily.         No current facility-administered medications for this visit.    ROS: Pertinent items  are noted in HPI.  Social Hx:  Single, no children, works at a [employer] to the children  Exam:    BP 110/68  Pulse 72  Resp 16  Ht 5\' 3"  (1.6 m)  Wt 243 lb (110.224 kg)  BMI 43.06 kg/m2   Wt Readings from Last 3 Encounters:  01/06/13 243 lb (110.224 kg)  06/26/11 250 lb 12.8 oz (113.762 kg)  02/17/11 258 lb 9.6 oz (117.3 kg)     Ht Readings from Last 3 Encounters:  01/06/13 5\' 3"  (1.6 m)  06/26/11 5' (1.524 m)  02/17/11 5' (1.524 m)    General appearance: alert, cooperative and appears stated age Head: Normocephalic, without obvious abnormality, atraumatic Neck: no adenopathy, supple, symmetrical, trachea midline and thyroid not enlarged, symmetric, no tenderness/mass/nodules Lungs: clear to auscultation bilaterally Breasts: Inspection negative, No nipple retraction or dimpling, No nipple discharge or bleeding, No axillary or supraclavicular adenopathy, Normal to palpation without dominant masses, very large and pendulous Heart: regular rate and rhythm Abdomen: soft, non-tender; bowel sounds normal; no masses,  no organomegaly, very obese Extremities: extremities normal, atraumatic, no cyanosis or edema Skin: Skin color, texture, turgor normal. No  rashes or lesions Lymph nodes: Cervical, supraclavicular, and axillary nodes normal. No abnormal inguinal nodes palpated Neurologic: Grossly normal   Pelvic: External genitalia:  no lesions              Urethra:  normal appearing urethra with no masses, tenderness or lesions              Bartholins and Skenes: normal                 Vagina: normal appearing vagina with normal color and discharge, no lesions              Cervix: normal appearance              Pap taken: yes        Bimanual Exam:  Uterus:  uterus is difficult to evaluate d/t habitus.  It is  nontender                                    Adnexa: nontender and no masses                                      Rectovaginal: Confirms                                       Anus:  normal sphincter tone, no lesions  A: normal menopausal exam, no HRT     bilat salpingectomy for ectopic in 1994     lupron for 6 mos in 2008 as tx for menorrhagia     obesity     P: mammogram pap smear counseled on breast self exam, mammography screening, adequate intake of calcium and vitamin D, diet and exercise return annually or prn     An After Visit Summary was printed and given to the patient.

## 2013-01-06 NOTE — Patient Instructions (Signed)

## 2013-01-27 ENCOUNTER — Ambulatory Visit
Admission: RE | Admit: 2013-01-27 | Discharge: 2013-01-27 | Disposition: A | Discharge status: Home / Self Care | Payer: Federal, State, Local not specified - PPO | Source: Ambulatory Visit

## 2013-01-27 DIAGNOSIS — Z1231 Encounter for screening mammogram for malignant neoplasm of breast: Secondary | ICD-10-CM

## 2013-02-07 ENCOUNTER — Ambulatory Visit (INDEPENDENT_AMBULATORY_CARE_PROVIDER_SITE_OTHER): Payer: Federal, State, Local not specified - PPO | Admitting: Obstetrics and Gynecology

## 2013-02-07 ENCOUNTER — Ambulatory Visit (INDEPENDENT_AMBULATORY_CARE_PROVIDER_SITE_OTHER): Payer: Federal, State, Local not specified - PPO

## 2013-02-07 ENCOUNTER — Encounter: Payer: Self-pay | Admitting: Obstetrics and Gynecology

## 2013-02-07 DIAGNOSIS — D251 Intramural leiomyoma of uterus: Secondary | ICD-10-CM

## 2013-02-07 DIAGNOSIS — D259 Leiomyoma of uterus, unspecified: Secondary | ICD-10-CM

## 2013-02-07 DIAGNOSIS — N852 Hypertrophy of uterus: Secondary | ICD-10-CM

## 2013-02-07 NOTE — Progress Notes (Signed)
63 yo SBR G5P1 with BMI over 40, here for PUS to evaluate uterus and ovaries d/t unsatisfactory clinical exam d/t habitus.      Discussed findings with pt.  Multiple small fibroids, largest 3 x 3 cm. At time of last PUS, uterus was 14 x 7 x 10 cm, so it is much smaller now than it was then.  Pt is asymptomatic. No intervention is necessary.  Pt reassured all looks ok.  Continue routine care next July.

## 2013-02-07 NOTE — Patient Instructions (Signed)
Continue with your annual exams every year.  Consider repeating the ultrasound every two to three years.

## 2013-12-05 ENCOUNTER — Encounter: Payer: Self-pay | Admitting: Internal Medicine

## 2014-01-09 ENCOUNTER — Ambulatory Visit (INDEPENDENT_AMBULATORY_CARE_PROVIDER_SITE_OTHER): Payer: Federal, State, Local not specified - PPO | Admitting: Nurse Practitioner

## 2014-01-09 ENCOUNTER — Encounter: Payer: Self-pay | Admitting: Nurse Practitioner

## 2014-01-09 VITALS — BP 108/60 | HR 76 | Resp 18 | Ht 59.5 in | Wt 250.0 lb

## 2014-01-09 DIAGNOSIS — Z01419 Encounter for gynecological examination (general) (routine) without abnormal findings: Secondary | ICD-10-CM

## 2014-01-09 DIAGNOSIS — M899 Disorder of bone, unspecified: Secondary | ICD-10-CM

## 2014-01-09 DIAGNOSIS — M8580 Other specified disorders of bone density and structure, unspecified site: Secondary | ICD-10-CM

## 2014-01-09 DIAGNOSIS — M949 Disorder of cartilage, unspecified: Secondary | ICD-10-CM

## 2014-01-09 DIAGNOSIS — Z0000 Encounter for general adult medical examination without abnormal findings: Secondary | ICD-10-CM

## 2014-01-09 NOTE — Progress Notes (Signed)
63 y.o. [id number] Divorced African American Fe here for annual exam.  Not SA since 1976.  Last SA 1997.  No new health problems.    Patient's last menstrual period was 08/27/2009.          Sexually active: No.  The current method of family planning is post menopausal status.    Exercising: No.  The patient does not participate in regular exercise at present. Smoker:  no  Health Maintenance: Pap: 12/2012 Neg. HR HPV: neg MMG: 01/2013 BIRADS1: neg Colonoscopy: 2005. Pt has scheduled for this month BMD:  2011. Pt due TDaP: 2008 Labs: PCP   reports that she has never smoked. She has never used smokeless tobacco. She reports that she does not drink alcohol or use illicit drugs.  Past Medical History  Diagnosis Date  . Hypertension     chronic  . Menorrhagia 2008    91mon Lupron Tx completed 09/2006  . Elevated cholesterol     Past Surgical History  Procedure Laterality Date  . Ectopic pregnancy surgery  1994    Bilat Salping  . Endometrial biopsy  04/2006    Current Outpatient Prescriptions  Medication Sig Dispense Refill  . atenolol (TENORMIN) 100 MG tablet Take 100 mg by mouth daily.        . Multiple Vitamin (MULTIVITAMIN) tablet Take 1 tablet by mouth daily.      Marland Kitchen omega-3 acid ethyl esters (LOVAZA) 1 G capsule Take 2 g by mouth 2 (two) times daily.        . simvastatin (ZOCOR) 20 MG tablet Take 20 mg by mouth at bedtime.        Marland Kitchen telmisartan-hydrochlorothiazide (MICARDIS HCT) 80-25 MG per tablet Take 1 tablet by mouth daily.         No current facility-administered medications for this visit.    Family History  Problem Relation Age of Onset  . Hypertension Mother   . Hypertension Sister   . Hypertension Maternal Grandmother     ROS:  Pertinent items are noted in HPI.  Otherwise, a comprehensive ROS was negative.  Exam:   BP 108/60  Pulse 76  Resp 18  Ht 4' 11.5" (1.511 m)  Wt 250 lb (113.399 kg)  BMI 49.67 kg/m2  LMP 08/27/2009 Height: 4' 11.5" (151.1 cm)  Ht  Readings from Last 3 Encounters:  01/09/14 4' 11.5" (1.511 m)  01/06/13 5\' 3"  (1.6 m)  06/26/11 5' (1.524 m)    General appearance: alert, cooperative and appears stated age Head: Normocephalic, without obvious abnormality, atraumatic Neck: no adenopathy, supple, symmetrical, trachea midline and thyroid normal to inspection and palpation Lungs: clear to auscultation bilaterally Breasts: normal appearance, no masses or tenderness Heart: regular rate and rhythm Abdomen: soft, non-tender; no masses,  no organomegaly Extremities: extremities normal, atraumatic, no cyanosis or edema Skin: Skin color, texture, turgor normal. No rashes or lesions Lymph nodes: Cervical, supraclavicular, and axillary nodes normal. No abnormal inguinal nodes palpated Neurologic: Grossly normal   Pelvic: External genitalia:  no lesions              Urethra:  normal appearing urethra with no masses, tenderness or lesions              Bartholin's and Skene's: normal                 Vagina: normal appearing vagina with normal color and discharge, no lesions              Cervix: anteverted  Pap taken: No. Bimanual Exam:  Uterus:  normal size, contour, position, consistency, mobility, non-tender              Adnexa: no mass, fullness, tenderness               Rectovaginal: Confirms               Anus:  normal sphincter tone, no lesions  A:  Well Woman with normal exam  Postmenopausal  Osteopenia  HTN  obesity  P:   Reviewed health and wellness pertinent to exam  Pap smear not taken today  Mammogram is due 8/15  Will get BMD done at same time  Counseled on breast self exam, mammography screening, adequate intake of calcium and vitamin D, diet and exercise, Kegel's exercises return annually or prn  An After Visit Summary was printed and given to the patient.

## 2014-01-09 NOTE — Patient Instructions (Signed)

## 2014-01-11 ENCOUNTER — Ambulatory Visit (AMBULATORY_SURGERY_CENTER): Payer: Self-pay

## 2014-01-11 VITALS — Ht 60.0 in | Wt 250.0 lb

## 2014-01-11 DIAGNOSIS — Z860100 Personal history of colon polyps, unspecified: Secondary | ICD-10-CM

## 2014-01-11 MED ORDER — MOVIPREP 100 G PO SOLR: 1.0000 | Freq: Once | ORAL | Status: DC

## 2014-01-11 NOTE — Progress Notes (Signed)
Note reviewed, agree with plan.  Natassja Ollis, MD  

## 2014-01-11 NOTE — Progress Notes (Signed)
No allergies to eggs or soy No past problems with anesthesia No home oxygen No diet/weight loss meds  Has email  Emmi instructions given for colonoscopy 

## 2014-01-18 ENCOUNTER — Other Ambulatory Visit: Payer: Self-pay

## 2014-01-18 DIAGNOSIS — Z1231 Encounter for screening mammogram for malignant neoplasm of breast: Secondary | ICD-10-CM

## 2014-01-24 ENCOUNTER — Encounter: Payer: Federal, State, Local not specified - PPO | Admitting: Internal Medicine

## 2014-01-30 ENCOUNTER — Ambulatory Visit
Admission: RE | Admit: 2014-01-30 | Discharge: 2014-01-30 | Disposition: A | Discharge status: Home / Self Care | Payer: Federal, State, Local not specified - PPO | Source: Ambulatory Visit

## 2014-01-30 ENCOUNTER — Ambulatory Visit: Payer: Federal, State, Local not specified - PPO

## 2014-01-30 ENCOUNTER — Ambulatory Visit
Admission: RE | Admit: 2014-01-30 | Discharge: 2014-01-30 | Disposition: A | Discharge status: Home / Self Care | Payer: Federal, State, Local not specified - PPO | Source: Ambulatory Visit | Attending: Nurse Practitioner | Admitting: Nurse Practitioner

## 2014-01-30 DIAGNOSIS — M8580 Other specified disorders of bone density and structure, unspecified site: Secondary | ICD-10-CM

## 2014-01-30 DIAGNOSIS — Z1231 Encounter for screening mammogram for malignant neoplasm of breast: Secondary | ICD-10-CM

## 2014-04-06 ENCOUNTER — Encounter: Payer: Self-pay | Admitting: Internal Medicine

## 2014-06-12 ENCOUNTER — Ambulatory Visit (AMBULATORY_SURGERY_CENTER): Payer: Self-pay

## 2014-06-12 VITALS — Ht 60.0 in | Wt 249.0 lb

## 2014-06-12 DIAGNOSIS — Z86010 Personal history of colonic polyps: Secondary | ICD-10-CM

## 2014-06-12 NOTE — Progress Notes (Signed)
Per pt, no allergies to soy or egg products.Pt not taking any weight loss meds or using  O2 at home.   Pt came into the office today for her pre-visit prior to her colonoscopy on 06/26/14. Pt already has her Moviprep bowel prep from an earlier PV in July. No prep ordered at this time.

## 2014-06-26 ENCOUNTER — Encounter: Payer: Self-pay | Admitting: Internal Medicine

## 2014-06-26 ENCOUNTER — Ambulatory Visit (AMBULATORY_SURGERY_CENTER): Payer: Federal, State, Local not specified - PPO | Admitting: Internal Medicine

## 2014-06-26 VITALS — BP 111/66 | HR 58 | Temp 98.3°F | Resp 15 | Ht 60.0 in | Wt 249.0 lb

## 2014-06-26 DIAGNOSIS — Z1211 Encounter for screening for malignant neoplasm of colon: Secondary | ICD-10-CM

## 2014-06-26 DIAGNOSIS — Z86010 Personal history of colonic polyps: Secondary | ICD-10-CM

## 2014-06-26 MED ORDER — SODIUM CHLORIDE 0.9 % IV SOLN: 500.0000 mL | INTRAVENOUS | Status: DC

## 2014-06-26 NOTE — Op Note (Signed)
[facility]  [address] [place], [address]   COLONOSCOPY PROCEDURE REPORT  PATIENT: [name], [name]  MR#: [id number] BIRTHDATE: October 10, 1950 , 53  yrs. old GENDER: female ENDOSCOPIST: Lafayette Dragon, MD REFERRED BY:Dr Kristie Cowman PROCEDURE DATE:  06/26/2014 PROCEDURE:   Colonoscopy, screening First Screening Colonoscopy - Avg.  risk and is 50 yrs.  old or older - No.  Prior Negative Screening - Now for repeat screening. N/A  History of Adenoma - Now for follow-up colonoscopy & has been > or = to 3 yrs.  N/A  Polyps Removed Today? No.  Polyps Removed Today? No.  Recommend repeat exam, <10 yrs? Polyps Removed Today? No.  Recommend repeat exam, <10 yrs? No. ASA CLASS:   Class II INDICATIONS:hyperplastic colon polyp removed in June 2005.  History of hemorrhoids. MEDICATIONS: Monitored anesthesia care and Propofol 250 mg IV  DESCRIPTION OF PROCEDURE:   After the risks benefits and alternatives of the procedure were thoroughly explained, informed consent was obtained.  The digital rectal exam revealed no abnormalities of the rectum.   The LB [id number]  endoscope was introduced through the anus and advanced to the cecum, which was identified by both the appendix and ileocecal valve. No adverse events experienced.   The quality of the prep was good, using MoviPrep  The instrument was then slowly withdrawn as the colon was fully examined.      COLON FINDINGS: There was mild diverticulosis noted throughout the entire examined colon with associated muscular hypertrophy and angulation.  Retroflexed views revealed no abnormalities. The time to cecum=11 minutes 46 seconds.  Withdrawal time=6 minutes 38 seconds.  The scope was withdrawn and the procedure completed. COMPLICATIONS: There were no immediate complications.  ENDOSCOPIC IMPRESSION: There was mild diverticulosis noted throughout the entire examined colon  RECOMMENDATIONS: High fiber diet Recall  colonoscopy in 10 years  eSigned:  Lafayette Dragon, MD 06/26/2014 12:13 PM   cc:

## 2014-06-26 NOTE — Patient Instructions (Signed)
YOU HAD AN ENDOSCOPIC PROCEDURE TODAY AT THE [facility]: Refer to the procedure report that was given to you for any specific questions about what was found during the examination.  If the procedure report does not answer your questions, please call your gastroenterologist to clarify.  If you requested that your care partner not be given the details of your procedure findings, then the procedure report has been included in a sealed envelope for you to review at your convenience later.  YOU SHOULD EXPECT: Some feelings of bloating in the abdomen. Passage of more gas than usual.  Walking can help get rid of the air that was put into your GI tract during the procedure and reduce the bloating. If you had a lower endoscopy (such as a colonoscopy or flexible sigmoidoscopy) you may notice spotting of blood in your stool or on the toilet paper. If you underwent a bowel prep for your procedure, then you may not have a normal bowel movement for a few days.  DIET: Your first meal following the procedure should be a light meal and then it is ok to progress to your normal diet.  A half-sandwich or bowl of soup is an example of a good first meal.  Heavy or fried foods are harder to digest and may make you feel nauseous or bloated.  Likewise meals heavy in dairy and vegetables can cause extra gas to form and this can also increase the bloating.  Drink plenty of fluids but you should avoid alcoholic beverages for 24 hours.  ACTIVITY: Your care partner should take you home directly after the procedure.  You should plan to take it easy, moving slowly for the rest of the day.  You can resume normal activity the day after the procedure however you should NOT DRIVE or use heavy machinery for 24 hours (because of the sedation medicines used during the test).    SYMPTOMS TO REPORT IMMEDIATELY: A gastroenterologist can be reached at any hour.  During normal business hours, 8:30 AM to 5:00 PM Monday through Friday,  call [phone].  After hours and on weekends, please call the GI answering service at [phone] who will take a message and have the physician on call contact you.   Following lower endoscopy (colonoscopy or flexible sigmoidoscopy):  Excessive amounts of blood in the stool  Significant tenderness or worsening of abdominal pains  Swelling of the abdomen that is new, acute  Fever of 100F or higher  FOLLOW UP: If any biopsies were taken you will be contacted by phone or by letter within the next 1-3 weeks.  Call your gastroenterologist if you have not heard about the biopsies in 3 weeks.  Our staff will call the home number listed on your records the next business day following your procedure to check on you and address any questions or concerns that you may have at that time regarding the information given to you following your procedure. This is a courtesy call and so if there is no answer at the home number and we have not heard from you through the emergency physician on call, we will assume that you have returned to your regular daily activities without incident.  SIGNATURES/CONFIDENTIALITY: You and/or your care partner have signed paperwork which will be entered into your electronic medical record.  These signatures attest to the fact that that the information above on your After Visit Summary has been reviewed and is understood.  Full responsibility of the confidentiality of this   discharge information lies with you and/or your care-partner.     Handouts were given to your care partner on diverticulosis and a high fiber diet with liberal fluid intake. You may resume your current medications today. Please call if any questions or concerns.

## 2014-06-26 NOTE — Progress Notes (Signed)
No problems noted in the recovery room. maw 

## 2014-06-26 NOTE — Progress Notes (Signed)
Report to PACU, RN, vss, BBS= Clear.  

## 2014-06-27 ENCOUNTER — Telehealth: Payer: Self-pay

## 2014-06-27 NOTE — Telephone Encounter (Signed)
  Follow up Call-  Call back number 06/26/2014  Post procedure Call Back phone  # [phone] cell  Permission to leave phone message Yes     Patient questions:  Do you have a fever, pain , or abdominal swelling? No. Pain Score  0 *  Have you tolerated food without any problems? Yes.    Have you been able to return to your normal activities? Yes.    Do you have any questions about your discharge instructions: Diet   No. Medications  No. Follow up visit  No.  Do you have questions or concerns about your Care? No.  Actions: * If pain score is 4 or above: No action needed, pain <4.

## 2014-09-09 ENCOUNTER — Encounter (HOSPITAL_BASED_OUTPATIENT_CLINIC_OR_DEPARTMENT_OTHER): Payer: Self-pay

## 2014-09-09 ENCOUNTER — Emergency Department (HOSPITAL_BASED_OUTPATIENT_CLINIC_OR_DEPARTMENT_OTHER): Payer: Federal, State, Local not specified - PPO

## 2014-09-09 ENCOUNTER — Emergency Department (HOSPITAL_BASED_OUTPATIENT_CLINIC_OR_DEPARTMENT_OTHER)
Admission: EM | Admit: 2014-09-09 | Discharge: 2014-09-09 | Disposition: A | Discharge status: Home / Self Care | Payer: Federal, State, Local not specified - PPO | Attending: Emergency Medicine | Admitting: Emergency Medicine

## 2014-09-09 DIAGNOSIS — Z87448 Personal history of other diseases of urinary system: Secondary | ICD-10-CM | POA: Insufficient documentation

## 2014-09-09 DIAGNOSIS — I10 Essential (primary) hypertension: Secondary | ICD-10-CM | POA: Diagnosis not present

## 2014-09-09 DIAGNOSIS — Z8719 Personal history of other diseases of the digestive system: Secondary | ICD-10-CM | POA: Diagnosis not present

## 2014-09-09 DIAGNOSIS — R112 Nausea with vomiting, unspecified: Secondary | ICD-10-CM | POA: Insufficient documentation

## 2014-09-09 DIAGNOSIS — Z862 Personal history of diseases of the blood and blood-forming organs and certain disorders involving the immune mechanism: Secondary | ICD-10-CM | POA: Insufficient documentation

## 2014-09-09 DIAGNOSIS — R42 Dizziness and giddiness: Secondary | ICD-10-CM | POA: Diagnosis present

## 2014-09-09 DIAGNOSIS — Z79899 Other long term (current) drug therapy: Secondary | ICD-10-CM | POA: Diagnosis not present

## 2014-09-09 MED ORDER — ONDANSETRON HCL 4 MG PO TABS: 4.0000 mg | ORAL_TABLET | Freq: Four times a day (QID) | ORAL | Status: DC

## 2014-09-09 MED ORDER — ONDANSETRON 4 MG PO TBDP
4.0000 mg | ORAL_TABLET | Freq: Once | ORAL | Status: AC
  Administered 2014-09-09: 4 mg via ORAL
  Filled 2014-09-09: qty 1

## 2014-09-09 MED ORDER — MECLIZINE HCL 50 MG PO TABS: 50.0000 mg | ORAL_TABLET | Freq: Three times a day (TID) | ORAL | Status: DC | PRN

## 2014-09-09 MED ORDER — MECLIZINE HCL 25 MG PO TABS
25.0000 mg | ORAL_TABLET | Freq: Once | ORAL | Status: AC
  Administered 2014-09-09: 25 mg via ORAL
  Filled 2014-09-09: qty 1

## 2014-09-09 NOTE — ED Notes (Signed)
Patient reports that she awoke this am with dizziness. Reports that the room is spinning and has been having nausea and vomiting with same. Vomiting x 2, denies any trauma. Alert and oriented. Reports when she went to bed she felt fine last night

## 2014-09-09 NOTE — Discharge Instructions (Signed)

## 2014-09-09 NOTE — ED Provider Notes (Signed)
CSN: [id number]     Arrival date & time 09/09/14  1118 History   First MD Initiated Contact with Patient 09/09/14 1131     Chief Complaint  Patient presents with  . Dizziness      HPI  Patient presents evaluation of dizziness. States she had a normal day yesterday a normal night last night. Upon sitting up supporting to get out of bed she thought the room were spinning. She was nauseated and had an episode of vomiting. Her nausea persists, but is milder. Her vertigo symptoms also improved, but persists somewhat mildly as well. No headache. No falls or injury. No numbness weakness tingling. No additional neurological symptoms. Past similar episode over 5 years ago that resolved spontaneously.  Past Medical History  Diagnosis Date  . Hypertension     chronic  . Menorrhagia 2008    56mon Lupron Tx completed 09/2006  . Elevated cholesterol   . Anemia   . Hemorrhoids    Past Surgical History  Procedure Laterality Date  . Ectopic pregnancy surgery  1994    Bilat Salping  . Endometrial biopsy  04/2006   Family History  Problem Relation Age of Onset  . Hypertension Mother   . Hypertension Sister   . Hypertension Maternal Grandmother   . Alcoholism Father   . Colon cancer Neg Hx   . Esophageal cancer Neg Hx   . Rectal cancer Neg Hx   . Stomach cancer Neg Hx    History  Substance Use Topics  . Smoking status: Never Smoker   . Smokeless tobacco: Never Used  . Alcohol Use: No   OB History    Gravida Para Term Preterm AB TAB SAB Ectopic Multiple Living   4 1  1 2   1  1      Review of Systems  Constitutional: Negative for fever, chills, diaphoresis, appetite change and fatigue.  HENT: Negative for mouth sores, sore throat and trouble swallowing.   Eyes: Negative for visual disturbance.  Respiratory: Negative for cough, chest tightness, shortness of breath and wheezing.   Cardiovascular: Negative for chest pain.  Gastrointestinal: Positive for nausea and vomiting. Negative for  abdominal pain, diarrhea and abdominal distention.  Endocrine: Negative for polydipsia, polyphagia and polyuria.  Genitourinary: Negative for dysuria, frequency and hematuria.  Musculoskeletal: Negative for gait problem.  Skin: Negative for color change, pallor and rash.  Neurological: Positive for dizziness. Negative for syncope, light-headedness and headaches.  Hematological: Does not bruise/bleed easily.  Psychiatric/Behavioral: Negative for behavioral problems and confusion.      Allergies  Review of patient's allergies indicates no known allergies.  Home Medications   Prior to Admission medications   Medication Sig Start Date End Date Taking? Authorizing Provider  metFORMIN (GLUCOPHAGE) 500 MG tablet Take by mouth 2 (two) times daily with a meal.   Yes Historical Provider, MD  atenolol (TENORMIN) 100 MG tablet Take 100 mg by mouth daily.      Historical Provider, MD  Multiple Vitamin (MULTIVITAMIN) tablet Take 1 tablet by mouth daily.    Historical Provider, MD  omega-3 acid ethyl esters (LOVAZA) 1 G capsule Take 2 g by mouth 2 (two) times daily.      Historical Provider, MD  simvastatin (ZOCOR) 20 MG tablet Take 20 mg by mouth at bedtime.      Historical Provider, MD  telmisartan-hydrochlorothiazide (MICARDIS HCT) 80-25 MG per tablet Take 1 tablet by mouth daily.      Historical Provider, MD   BP 130/65  mmHg  Pulse 66  Temp(Src) 97.9 F (36.6 C) (Oral)  Resp 18  Ht 5' (1.524 m)  Wt 245 lb (111.131 kg)  BMI 47.85 kg/m2  SpO2 100%  LMP 08/27/2009 Physical Exam  Constitutional: She is oriented to person, place, and time. She appears well-developed and well-nourished. No distress.  HENT:  Head: Normocephalic.  Eyes: Conjunctivae are normal. Pupils are equal, round, and reactive to light. No scleral icterus.  Neck: Normal range of motion. Neck supple. No thyromegaly present.  Cardiovascular: Normal rate and regular rhythm.  Exam reveals no gallop and no friction rub.   No  murmur heard. Pulmonary/Chest: Effort normal and breath sounds normal. No respiratory distress. She has no wheezes. She has no rales.  Abdominal: Soft. Bowel sounds are normal. She exhibits no distension. There is no tenderness. There is no rebound.  Musculoskeletal: Normal range of motion.  Neurological: She is alert and oriented to person, place, and time.  Subjectively reports spending was sitting upright. No horizontal nystagmus. Has nystagmus with lateral gaze.  Skin: Skin is warm and dry. No rash noted.  Psychiatric: She has a normal mood and affect. Her behavior is normal.    ED Course  Procedures (including critical care time) Labs Review Labs Reviewed - No data to display  Imaging Review Ct Head Wo Contrast  09/09/2014   CLINICAL DATA:  Dizziness, nausea and vomiting.  EXAM: CT HEAD WITHOUT CONTRAST  TECHNIQUE: Contiguous axial images were obtained from the base of the skull through the vertex without intravenous contrast.  COMPARISON:  None  FINDINGS: No acute cortical infarct, hemorrhage, or mass lesion ispresent. Ventricles are of normal size. No significant extra-axial fluid collection is present. The paranasal sinuses andmastoid air cells are clear. The osseous skull is intact.  IMPRESSION: Negative exam.   Electronically Signed   By: Kerby Moors M.D.   On: 09/09/2014 12:23     EKG Interpretation None      MDM   Final diagnoses:  Vertigo    Feeling much improved after Zofran, and Antivert. CT shows no acute process. She is amateur here with no symptoms. Plan is home, Antivert when necessary. No driving until 24 hours without symptoms. Take Antivert until 24 hours without symptoms.    Tanna Furry, MD 09/09/14 1320

## 2014-09-09 NOTE — ED Notes (Signed)
MD at bedside. 

## 2014-09-10 LAB — CBG MONITORING, ED: Glucose-Capillary: 145 mg/dL — ABNORMAL HIGH (ref 70–99)

## 2014-09-18 ENCOUNTER — Encounter: Payer: Federal, State, Local not specified - PPO | Attending: Family Medicine | Admitting: *Deleted

## 2014-09-18 ENCOUNTER — Encounter: Payer: Self-pay | Admitting: *Deleted

## 2014-09-18 VITALS — Ht 60.0 in | Wt 241.6 lb

## 2014-09-18 DIAGNOSIS — E119 Type 2 diabetes mellitus without complications: Secondary | ICD-10-CM | POA: Insufficient documentation

## 2014-09-18 DIAGNOSIS — Z713 Dietary counseling and surveillance: Secondary | ICD-10-CM | POA: Diagnosis not present

## 2014-09-18 NOTE — Progress Notes (Signed)
Diabetes Self-Management Education  Visit Type:  DSME  Appt. Start Time: 1545 Appt. End Time: 1025  09/18/2014  [name], identified by name and date of birth, is a 64 y.o. female with a diagnosis of Diabetes: Type 2.  Other people present during visit:  Patient . [name] has previous attended an intense 7 hour education clas with [facility] in 2012. She then did a  1:1 follow up with RD, CDE. She return today with the objective of "beeing healthier" and "getting off the Metformin".  ASSESSMENT  Height 5' (1.524 m), weight 241 lb 9.6 oz (109.589 kg), last menstrual period 08/27/2009. Body mass index is 47.18 kg/(m^2).  Initial Visit Information:  Are you currently following a meal plan?: No Are you taking your medications as prescribed?: Yes How often do you need to have someone help you when you read instructions, pamphlets, or other written materials from your doctor or pharmacy?: 1 - Never  Psychosocial:   Patient Belief/Attitude about Diabetes: Motivated to manage diabetes Self-care barriers: None Self-management support: Doctor's office, CDE visits Other persons present: Patient Patient Concerns: Nutrition/Meal planning, Healthy Lifestyle, Weight Control Special Needs: None Preferred Learning Style: No preference indicated Learning Readiness: Ready  Complications:   Last HgB A1C per patient/outside source: 6.8 mg/dL How often do you check your blood sugar?: Not recommended by provider Have you had a dilated eye exam in the past 12 months?: Yes Have you had a dental exam in the past 12 months?: Yes  Diet Intake:  Breakfast: 10:00  orange, gold fish crackers / apples & peanut butter / hot dog in bun, fruit Lunch: noon: ground beef, tortilla, cheese, beans, rice Snack (afternoon): almonds, fruit, water Dinner: 6:00pm  beef, rice, vegetables Snack (evening): yogurt,  Beverage(s): water, hot green tea   Exercise:  Exercise: ADL's  Individualized Plan for  Diabetes Self-Management Training:   Learning Objective:  Patient will have a greater understanding of diabetes self-management. Patient education plan per assessed needs and concerns is to attend individual sessions     Education Topics Reviewed with Patient Today:  Definition of diabetes, type 1 and 2, and the diagnosis of diabetes Role of diet in the treatment of diabetes and the relationship between the three main macronutrients and blood glucose level, Food label reading, portion sizes and measuring food., Information on hints to eating out and maintain blood glucose control., Meal options for control of blood glucose level and chronic complications. Role of exercise on diabetes management, blood pressure control and cardiac health. Yearly dilated eye exam, Identified appropriate SMBG and/or A1C goals. Relationship between chronic complications and blood glucose control, Dental care  PATIENTS GOALS/Plan (Developed by the patient):  Nutrition: General guidelines for healthy choices and portions discussed Physical Activity: Exercise 3-5 times per week, 30 minutes per day Reducing Risk: get labs drawn  Patient Instructions  .Plan:  Aim for 3 Carb Choices per meal (45 grams) +/- 1 either way (try to stick closer to 2 choices/30grams) Aim for 0-15 Carbs per snack if hungry  Include protein in moderation with your meals and snacks Consider reading food labels for Total Carbohydrate and Fat Grams of foods Consider  increasing your activity level by walking for 30 minutes daily as tolerated Continue taking medication as directed by MD  [place] or from [employer] or [employer] Brand Crackers & cheese/peanut butter get 4 pack at [employer] extra think sliced bread! [name] / [employer] reduced calorie (45-50) reduces carbohydrates to 1-43 grams per slice  Cereal:  Cheerios, Special K Protein, 1/2C milk, 1/2 banana   Going out to [employer] - get a to-Go box and place 1/2 your meal in the to-Go box and have it as a meal for tomorrow.   Expected Outcomes:  Demonstrated interest in learning. Expect positive outcomes (Goal is to be able to come off the Metformin)  Education material provided: Living Well with Diabetes, A1C conversion sheet, Meal plan card and Snack sheet  If problems or questions, patient to contact team via:  Phone  Future DSME appointment: PRN

## 2014-09-18 NOTE — Patient Instructions (Addendum)
.  Plan:  Aim for 3 Carb Choices per meal (45 grams) +/- 1 either way (try to stick closer to 2 choices/30grams) Aim for 0-15 Carbs per snack if hungry  Include protein in moderation with your meals and snacks Consider reading food labels for Total Carbohydrate and Fat Grams of foods Consider  increasing your activity level by walking for 30 minutes daily as tolerated Continue taking medication as directed by MD  [facility] [place] or from [employer] or [employer] Brand Crackers & cheese/peanut butter get 4 pack at [place] extra think sliced bread! [name] / [employer] reduced calorie (45-50) reduces carbohydrates to 7-10 grams per slice  Cereal: Cheerios, Special K Protein, 1/2C milk, 1/2 banana   Going out to [employer] - get a to-Go box and place 1/2 your meal in the to-Go box and have it as a meal for tomorrow.

## 2015-01-10 ENCOUNTER — Other Ambulatory Visit: Payer: Self-pay

## 2015-01-10 DIAGNOSIS — Z1231 Encounter for screening mammogram for malignant neoplasm of breast: Secondary | ICD-10-CM

## 2015-01-15 ENCOUNTER — Ambulatory Visit (INDEPENDENT_AMBULATORY_CARE_PROVIDER_SITE_OTHER): Payer: Federal, State, Local not specified - PPO | Admitting: Nurse Practitioner

## 2015-01-15 ENCOUNTER — Encounter: Payer: Self-pay | Admitting: Nurse Practitioner

## 2015-01-15 VITALS — BP 118/76 | HR 64 | Ht 60.0 in | Wt 232.0 lb

## 2015-01-15 DIAGNOSIS — Z01419 Encounter for gynecological examination (general) (routine) without abnormal findings: Secondary | ICD-10-CM | POA: Diagnosis not present

## 2015-01-15 NOTE — Patient Instructions (Addendum)

## 2015-01-15 NOTE — Progress Notes (Signed)
Patient ID: [name], female   DOB: 01-29-1951, 64 y.o.   MRN: [id number] 64 y.o. [id number] Single  African American Fe here for annual exam.  No vaso symptoms.  No vaginal dryness.  Not dating. Still walking and lost 18 pounds.  Patient's last menstrual period was 08/27/2009 (exact date).          Sexually active: No.  The current method of family planning is abstinence and post menopausal status.    Exercising: Yes.    walking and/or aerobic exercising everyday Smoker:  no  Health Maintenance: Pap:  01/06/13, negative with neg HR HPV  MMG:  01/30/14, Bi-Rads 1:  Negative Pt has scheduled for next month Colonoscopy: 06/26/2014 mild diverticulosis otherwise normal recheck in 10 years BMD: 01/30/14, T-Score -0.1 L/0.3 Left Forearm; forearm substituted for spine due to degenerative sclerosis artifactually raising the bone mineral density TDaP: 2008 Labs: PCP   reports that she has never smoked. She has never used smokeless tobacco. She reports that she does not drink alcohol or use illicit drugs.  Past Medical History  Diagnosis Date  . Hypertension     chronic  . Menorrhagia 2008    1mon Lupron Tx completed 09/2006  . Elevated cholesterol   . Anemia   . Hemorrhoids   . Diabetes mellitus without complication     Past Surgical History  Procedure Laterality Date  . Ectopic pregnancy surgery  1994    Bilat Salping  . Endometrial biopsy  04/2006    Current Outpatient Prescriptions  Medication Sig Dispense Refill  . atenolol (TENORMIN) 100 MG tablet Take 100 mg by mouth daily.      . metFORMIN (GLUCOPHAGE) 500 MG tablet Take by mouth 2 (two) times daily with a meal.    . Multiple Vitamin (MULTIVITAMIN) tablet Take 1 tablet by mouth daily.    Marland Kitchen omega-3 acid ethyl esters (LOVAZA) 1 G capsule Take 2 g by mouth 2 (two) times daily.      . simvastatin (ZOCOR) 20 MG tablet Take 20 mg by mouth at bedtime.      Marland Kitchen telmisartan-hydrochlorothiazide (MICARDIS HCT) 80-25 MG per tablet Take 1  tablet by mouth daily.      . meclizine (ANTIVERT) 50 MG tablet Take 1 tablet (50 mg total) by mouth 3 (three) times daily as needed. (Patient not taking: Reported on 09/18/2014) 30 tablet 0  . ondansetron (ZOFRAN) 4 MG tablet Take 1 tablet (4 mg total) by mouth every 6 (six) hours. (Patient not taking: Reported on 01/15/2015) 12 tablet 0   No current facility-administered medications for this visit.    Family History  Problem Relation Age of Onset  . Hypertension Mother   . Hypertension Sister   . Hypertension Maternal Grandmother   . Alcoholism Father   . Colon cancer Neg Hx   . Esophageal cancer Neg Hx   . Rectal cancer Neg Hx   . Stomach cancer Neg Hx     ROS:  Pertinent items are noted in HPI.  Otherwise, a comprehensive ROS was negative.  Exam:   BP 118/76 mmHg  Pulse 64  Ht 5' (1.524 m)  Wt 232 lb (105.235 kg)  BMI 45.31 kg/m2  LMP 08/27/2009 (Exact Date) Height: 5' (152.4 cm) Ht Readings from Last 3 Encounters:  01/15/15 5' (1.524 m)  09/18/14 5' (1.524 m)  09/09/14 5' (1.524 m)    General appearance: alert, cooperative and appears stated age Head: Normocephalic, without obvious abnormality, atraumatic Neck: no adenopathy, supple, symmetrical, trachea  midline and thyroid normal to inspection and palpation Lungs: clear to auscultation bilaterally Breasts: normal appearance, no masses or tenderness Heart: regular rate and rhythm Abdomen: soft, non-tender; no masses,  no organomegaly Extremities: extremities normal, atraumatic, no cyanosis or edema Skin: Skin color, texture, turgor normal. No rashes or lesions Lymph nodes: Cervical, supraclavicular, and axillary nodes normal. No abnormal inguinal nodes palpated Neurologic: Grossly normal   Pelvic: External genitalia:  no lesions              Urethra:  normal appearing urethra with no masses, tenderness or lesions              Bartholin's and Skene's: normal                 Vagina: normal appearing vagina with  normal color and discharge, no lesions              Cervix: anteverted              Pap taken: No. Bimanual Exam:  Uterus:  normal size, contour, position, consistency, mobility, non-tender              Adnexa: no mass, fullness, tenderness               Rectovaginal: Confirms               Anus:  normal sphincter tone, no lesions  Chaperone present:  no  A:  Well Woman with normal exam  Postmenopausal Degenerative disk disease HTN obesity  P:   Reviewed health and wellness pertinent to exam  Pap smear as above  Mammogram is due 01/2015  Counseled on breast self exam, mammography screening, adequate intake of calcium and vitamin D, diet and exercise return annually or prn  An After Visit Summary was printed and given to the patient.

## 2015-01-16 NOTE — Progress Notes (Signed)
Encounter reviewed by Dr. Brook Amundson C. Silva.  

## 2015-02-13 ENCOUNTER — Ambulatory Visit
Admission: RE | Admit: 2015-02-13 | Discharge: 2015-02-13 | Disposition: A | Discharge status: Home / Self Care | Payer: Federal, State, Local not specified - PPO | Source: Ambulatory Visit

## 2015-02-13 DIAGNOSIS — Z1231 Encounter for screening mammogram for malignant neoplasm of breast: Secondary | ICD-10-CM

## 2016-01-13 ENCOUNTER — Other Ambulatory Visit: Payer: Self-pay | Admitting: Nurse Practitioner

## 2016-01-13 DIAGNOSIS — Z1231 Encounter for screening mammogram for malignant neoplasm of breast: Secondary | ICD-10-CM

## 2016-01-17 ENCOUNTER — Encounter: Payer: Self-pay | Admitting: *Deleted

## 2016-01-20 ENCOUNTER — Ambulatory Visit (INDEPENDENT_AMBULATORY_CARE_PROVIDER_SITE_OTHER): Payer: Federal, State, Local not specified - PPO | Admitting: Nurse Practitioner

## 2016-01-20 ENCOUNTER — Encounter: Payer: Self-pay | Admitting: Nurse Practitioner

## 2016-01-20 VITALS — BP 118/70 | HR 80 | Resp 16 | Ht 59.75 in | Wt 236.0 lb

## 2016-01-20 DIAGNOSIS — Z0000 Encounter for general adult medical examination without abnormal findings: Secondary | ICD-10-CM | POA: Diagnosis not present

## 2016-01-20 DIAGNOSIS — Z01419 Encounter for gynecological examination (general) (routine) without abnormal findings: Secondary | ICD-10-CM | POA: Diagnosis not present

## 2016-01-20 LAB — HIV ANTIBODY (ROUTINE TESTING W REFLEX): HIV 1&2 Ab, 4th Generation: NONREACTIVE

## 2016-01-20 NOTE — Progress Notes (Signed)
Reviewed personally.  M. Suzanne Oree Mirelez, MD.  

## 2016-01-20 NOTE — Patient Instructions (Addendum)

## 2016-01-20 NOTE — Progress Notes (Signed)
Patient ID: [name], female   DOB: 09/30/1950, 65 y.o.   MRN: [id number]  65 y.o. [id number] Single  African American Fe here for annual exam.  No new health problems.  She feels well.  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          Sexually active: No.  The current method of family planning is post menopausal status.    Exercising: Yes.    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 She has never used smokeless tobacco. She reports that she does not drink alcohol or use drugs.  Past Medical History:  Diagnosis Date  . Anemia   . Diabetes mellitus without complication ([place])   . Elevated cholesterol   . Hemorrhoids   . Hypertension    chronic  . Menorrhagia 2008   61mon Lupron Tx completed 09/2006    Past Surgical History:  Procedure Laterality Date  . ECTOPIC PREGNANCY SURGERY  1994   Bilat Salping  . ENDOMETRIAL BIOPSY  04/2006    Current Outpatient Prescriptions  Medication Sig Dispense Refill  . atenolol (TENORMIN) 100 MG tablet Take 100 mg by mouth daily.      . meclizine (ANTIVERT) 50 MG tablet Take 1 tablet (50 mg total) by mouth 3 (three) times daily as needed. (Patient not taking: Reported on 09/18/2014) 30 tablet 0  . metFORMIN (GLUCOPHAGE) 500 MG tablet Take by mouth 2 (two) times daily with a meal.    . Multiple Vitamin (MULTIVITAMIN) tablet Take 1 tablet by mouth daily.    Marland Kitchen omega-3 acid ethyl  esters (LOVAZA) 1 G capsule Take 2 g by mouth 2 (two) times daily.      . ondansetron (ZOFRAN) 4 MG tablet Take 1 tablet (4 mg total) by mouth every 6 (six) hours. (Patient not taking: Reported on 01/15/2015) 12 tablet 0  . simvastatin (ZOCOR) 20 MG tablet Take 20 mg by mouth at bedtime.      Marland Kitchen telmisartan-hydrochlorothiazide (MICARDIS HCT) 80-25 MG per tablet Take 1 tablet by mouth daily.       No current facility-administered medications for this visit.     Family History  Problem Relation Age of Onset  . Hypertension Mother   . Hypertension Sister   . Hypertension Maternal Grandmother   . Alcoholism Father   . Colon cancer Neg Hx   . Esophageal cancer Neg Hx   . Rectal cancer Neg Hx   . Stomach cancer Neg Hx     ROS:  Pertinent items are noted in HPI.  Otherwise, a comprehensive ROS was negative.  Exam:   LMP 08/27/2009 (Exact Date)    Ht Readings from Last 3 Encounters:  01/15/15 5' (1.524 m)  09/18/14 5' (1.524 m)  09/09/14 5' (1.524 m)    General appearance: alert, cooperative and appears stated age Head: Normocephalic, without obvious abnormality, atraumatic Neck: no adenopathy, supple, symmetrical, trachea midline and thyroid normal to inspection and palpation Lungs: clear  to auscultation bilaterally Breasts: normal appearance, no masses or tenderness Heart: regular rate and rhythm Abdomen: soft, non-tender; no masses,  no organomegaly Extremities: extremities normal, atraumatic, no cyanosis or edema Skin: Skin color, texture, turgor normal. No rashes or lesions Lymph nodes: Cervical, supraclavicular, and axillary nodes normal. No abnormal inguinal nodes palpated Neurologic: Grossly normal   Pelvic: External genitalia:  no lesions              Urethra:  normal appearing urethra with no masses, tenderness or lesions              Bartholin's and Skene's: normal                 Vagina: normal appearing vagina with normal color and discharge, no lesions               Cervix: anteverted              Pap taken: Yes.   Bimanual Exam:  Uterus:  normal size, contour, position, consistency, mobility, non-tender              Adnexa: no mass, fullness, tenderness               Rectovaginal: Confirms               Anus:  normal sphincter tone, no lesions  Chaperone present: yes  A:  Well Woman with normal exam             Postmenopausal Degenerative disk disease HTN obesity   P:   Reviewed health and wellness pertinent to exam  Pap smear as above  Mammogram is due 01/2016  Follow with labs  Counseled on breast self exam, mammography screening, adequate intake of calcium and vitamin D, diet and exercise, Kegel's exercises return annually or prn  An After Visit Summary was printed and given to the patient.

## 2016-01-21 LAB — HEPATITIS C ANTIBODY: HCV AB: NEGATIVE

## 2016-01-21 LAB — IPS PAP TEST WITH HPV

## 2016-02-14 ENCOUNTER — Ambulatory Visit
Admission: RE | Admit: 2016-02-14 | Discharge: 2016-02-14 | Disposition: A | Discharge status: Home / Self Care | Payer: Federal, State, Local not specified - PPO | Source: Ambulatory Visit | Attending: Nurse Practitioner | Admitting: Nurse Practitioner

## 2016-02-14 DIAGNOSIS — Z1231 Encounter for screening mammogram for malignant neoplasm of breast: Secondary | ICD-10-CM

## 2016-12-28 ENCOUNTER — Telehealth: Payer: Self-pay | Admitting: Nurse Practitioner

## 2016-12-28 NOTE — Telephone Encounter (Signed)
Left message on voicemail to call and reschedule cancelled appointment. °

## 2017-01-15 ENCOUNTER — Other Ambulatory Visit: Payer: Self-pay | Admitting: Nurse Practitioner

## 2017-01-15 DIAGNOSIS — Z1231 Encounter for screening mammogram for malignant neoplasm of breast: Secondary | ICD-10-CM

## 2017-01-19 ENCOUNTER — Ambulatory Visit: Payer: Federal, State, Local not specified - PPO | Admitting: Obstetrics and Gynecology

## 2017-01-20 ENCOUNTER — Ambulatory Visit: Payer: Federal, State, Local not specified - PPO | Admitting: Nurse Practitioner

## 2017-02-11 NOTE — Progress Notes (Deleted)
66 y.o. [id number] Single African American female here for annual exam.    PCP:     Patient's last menstrual period was 08/27/2009 (exact date).           Sexually active: {yes no:314532}  The current method of family planning is post menopausal status.    Exercising: {yes no:314532}  {types:19826} Smoker:  no  Health Maintenance: Pap: 01-20-16 Neg:Neg HR HPV; History of abnormal Pap:  {YES NO:22349} MMG: 02-14-16 Density B/Neg/BiRads1:TBC Colonoscopy: 06/26/2014 mild diverticulosis otherwise normal recheck in 10 years BMD:  01-30-14  Result:Normal:TBC TDaP:  2008 Gardasil:   no HIV: 01-20-16 Neg Hep C: 01-20-16 Neg Screening Labs:  Hb today: ***, Urine today: ***   reports that she has never smoked. She has never used smokeless tobacco. She reports that she does not drink alcohol or use drugs.  Past Medical History:  Diagnosis Date  . Anemia   . Diabetes mellitus without complication ([place])   . Elevated cholesterol   . Hemorrhoids   . Hypertension    chronic  . Menorrhagia 2008   44mon Lupron Tx completed 09/2006    Past Surgical History:  Procedure Laterality Date  . ECTOPIC PREGNANCY SURGERY  1994   Bilat Salping  . ENDOMETRIAL BIOPSY  04/2006    Current Outpatient Prescriptions  Medication Sig Dispense Refill  . atenolol (TENORMIN) 100 MG tablet Take 100 mg by mouth daily.      Marland Kitchen loratadine (CLARITIN) 10 MG tablet Take 10 mg by mouth daily as needed.  0  . meclizine (ANTIVERT) 50 MG tablet Take 1 tablet (50 mg total) by mouth 3 (three) times daily as needed. 30 tablet 0  . metFORMIN (GLUCOPHAGE) 500 MG tablet Take by mouth 2 (two) times daily with a meal.    . Multiple Vitamin (MULTIVITAMIN) tablet Take 1 tablet by mouth daily.    . ondansetron (ZOFRAN) 4 MG tablet Take 1 tablet (4 mg total) by mouth every 6 (six) hours. 12 tablet 0  . simvastatin (ZOCOR) 20 MG tablet Take 20 mg by mouth at bedtime.      Marland Kitchen telmisartan-hydrochlorothiazide (MICARDIS HCT) 80-25 MG per  tablet Take 1 tablet by mouth daily.       No current facility-administered medications for this visit.     Family History  Problem Relation Age of Onset  . Hypertension Mother   . Hypertension Sister   . Hypertension Maternal Grandmother   . Alcoholism Father   . Colon cancer Neg Hx   . Esophageal cancer Neg Hx   . Rectal cancer Neg Hx   . Stomach cancer Neg Hx     ROS:  Pertinent items are noted in HPI.  Otherwise, a comprehensive ROS was negative.  Exam:   LMP 08/27/2009 (Exact Date)     General appearance: alert, cooperative and appears stated age Head: Normocephalic, without obvious abnormality, atraumatic Neck: no adenopathy, supple, symmetrical, trachea midline and thyroid normal to inspection and palpation Lungs: clear to auscultation bilaterally Breasts: normal appearance, no masses or tenderness, No nipple retraction or dimpling, No nipple discharge or bleeding, No axillary or supraclavicular adenopathy Heart: regular rate and rhythm Abdomen: soft, non-tender; no masses, no organomegaly Extremities: extremities normal, atraumatic, no cyanosis or edema Skin: Skin color, texture, turgor normal. No rashes or lesions Lymph nodes: Cervical, supraclavicular, and axillary nodes normal. No abnormal inguinal nodes palpated Neurologic: Grossly normal  Pelvic: External genitalia:  no lesions  Urethra:  normal appearing urethra with no masses, tenderness or lesions              Bartholins and Skenes: normal                 Vagina: normal appearing vagina with normal color and discharge, no lesions              Cervix: no lesions              Pap taken: {yes no:314532} Bimanual Exam:  Uterus:  normal size, contour, position, consistency, mobility, non-tender              Adnexa: no mass, fullness, tenderness              Rectal exam: {yes no:314532}.  Confirms.              Anus:  normal sphincter tone, no lesions  Chaperone was present for exam.  Assessment:    Well woman visit with normal exam.   Plan: Mammogram screening discussed. Recommended self breast awareness. Pap and HR HPV as above. Guidelines for Calcium, Vitamin D, regular exercise program including cardiovascular and weight bearing exercise.   Follow up annually and prn.   Additional counseling given.  {yes [id number]. _______ minutes face to face time of which over 50% was spent in counseling.    After visit summary provided.

## 2017-02-12 ENCOUNTER — Ambulatory Visit: Payer: Federal, State, Local not specified - PPO | Admitting: Obstetrics and Gynecology

## 2017-02-15 ENCOUNTER — Ambulatory Visit
Admission: RE | Admit: 2017-02-15 | Discharge: 2017-02-15 | Disposition: A | Discharge status: Home / Self Care | Payer: Federal, State, Local not specified - PPO | Source: Ambulatory Visit | Attending: Nurse Practitioner | Admitting: Nurse Practitioner

## 2017-02-15 DIAGNOSIS — Z1231 Encounter for screening mammogram for malignant neoplasm of breast: Secondary | ICD-10-CM

## 2017-02-16 ENCOUNTER — Encounter: Payer: Self-pay | Admitting: Obstetrics and Gynecology

## 2017-02-16 ENCOUNTER — Ambulatory Visit (INDEPENDENT_AMBULATORY_CARE_PROVIDER_SITE_OTHER): Payer: Federal, State, Local not specified - PPO | Admitting: Obstetrics and Gynecology

## 2017-02-16 VITALS — BP 110/70 | HR 78 | Resp 16 | Ht 59.5 in | Wt 236.0 lb

## 2017-02-16 DIAGNOSIS — Z01419 Encounter for gynecological examination (general) (routine) without abnormal findings: Secondary | ICD-10-CM

## 2017-02-16 DIAGNOSIS — N3281 Overactive bladder: Secondary | ICD-10-CM | POA: Diagnosis not present

## 2017-02-16 DIAGNOSIS — Z23 Encounter for immunization: Secondary | ICD-10-CM | POA: Diagnosis not present

## 2017-02-16 DIAGNOSIS — Z0000 Encounter for general adult medical examination without abnormal findings: Secondary | ICD-10-CM | POA: Diagnosis not present

## 2017-02-16 LAB — POCT URINALYSIS DIPSTICK
Bilirubin, UA: NEGATIVE
Blood, UA: NEGATIVE
GLUCOSE UA: NEGATIVE
Ketones, UA: NEGATIVE
Nitrite, UA: NEGATIVE
PH UA: 5 (ref 5.0–8.0)
Protein, UA: NEGATIVE
Urobilinogen, UA: 0.2 E.U./dL

## 2017-02-16 NOTE — Patient Instructions (Signed)

## 2017-02-16 NOTE — Progress Notes (Signed)
66 y.o. [id number] Single African American female here for annual exam.    Has frequent and urgent urination.  Can have urgency and leakage. DF - once every couple of hours or less.  Mapping bathrooms. NF - can vary. No leak with cough or sneeze.  Drinks sweet tea - caffeine beverage once a day.  No regular citrus beverages.   Denies dysuria or hematuria.   Takes HCTZ med. Has HgbA1C which is under 7.   Has one child and 4 grandchildren.  Retired from the [employer].  Works in school system.   PCP:   Kristie Cowman  Patient's last menstrual period was 08/27/2009 (exact date).           Sexually active: No.  The current method of family planning is post menopausal status.    Exercising: No.  The patient does not participate in regular exercise at present. Smoker:  no  Health Maintenance: Pap:  01/20/16 Neg. HR HPV:neg   01/06/13 Neg. HR HPV:neg  History of abnormal Pap:  no MMG:  02/14/16 BIRADS1:neg. Had MMG done yesterday. Results pending  Colonoscopy:  06/26/14 diverticulosis. f/u 10 years  BMD:   01/30/14  Result: normal TDaP:  2008?  HIV: 01/20/16 neg Hep C: 01/20/16 Neg  Screening Labs: PCP  UA: WBC=Trace   reports that she has never smoked. She has never used smokeless tobacco. She reports that she does not drink alcohol or use drugs.  Past Medical History:  Diagnosis Date  . Anemia   . Diabetes mellitus without complication ([place])   . Elevated cholesterol   . Hemorrhoids   . Hypertension    chronic  . Menorrhagia 2008   63mon Lupron Tx completed 09/2006    Past Surgical History:  Procedure Laterality Date  . ECTOPIC PREGNANCY SURGERY  1994   Bilat Salping  . ENDOMETRIAL BIOPSY  04/2006    Current Outpatient Prescriptions  Medication Sig Dispense Refill  . atenolol (TENORMIN) 100 MG tablet Take 100 mg by mouth daily.      Marland Kitchen loratadine (CLARITIN) 10 MG tablet Take 10 mg by mouth daily as needed.  0  . meclizine (ANTIVERT) 50 MG tablet Take 1 tablet (50 mg  total) by mouth 3 (three) times daily as needed. 30 tablet 0  . metFORMIN (GLUCOPHAGE) 500 MG tablet Take by mouth 2 (two) times daily with a meal.    . Multiple Vitamin (MULTIVITAMIN) tablet Take 1 tablet by mouth daily.    . ondansetron (ZOFRAN) 4 MG tablet Take 1 tablet (4 mg total) by mouth every 6 (six) hours. 12 tablet 0  . simvastatin (ZOCOR) 20 MG tablet Take 20 mg by mouth at bedtime.      Marland Kitchen telmisartan-hydrochlorothiazide (MICARDIS HCT) 80-25 MG per tablet Take 1 tablet by mouth daily.       No current facility-administered medications for this visit.     Family History  Problem Relation Age of Onset  . Hypertension Mother   . Hypertension Sister   . Hypertension Maternal Grandmother   . Alcoholism Father   . Colon cancer Neg Hx   . Esophageal cancer Neg Hx   . Rectal cancer Neg Hx   . Stomach cancer Neg Hx   . Breast cancer Neg Hx     ROS:  Pertinent items are noted in HPI.  Otherwise, a comprehensive ROS was negative.  Exam:   BP 110/70 (BP Location: Left Arm, Patient Position: Sitting, Cuff Size: Large)   Pulse 78  Resp 16   Ht 4' 11.5" (1.511 m)   Wt 236 lb (107 kg)   LMP 08/27/2009 (Exact Date)   BMI 46.87 kg/m     General appearance: alert, cooperative and appears stated age Head: Normocephalic, without obvious abnormality, atraumatic Neck: no adenopathy, supple, symmetrical, trachea midline and thyroid normal to inspection and palpation Lungs: clear to auscultation bilaterally Breasts: normal appearance, no masses or tenderness, No nipple retraction or dimpling, No nipple discharge or bleeding, No axillary or supraclavicular adenopathy Heart: regular rate and rhythm Abdomen: soft, non-tender; no masses, no organomegaly Extremities: extremities normal, atraumatic, no cyanosis or edema Skin: Skin color, texture, turgor normal. No rashes or lesions Lymph nodes: Cervical, supraclavicular, and axillary nodes normal. No abnormal inguinal nodes  palpated Neurologic: Grossly normal  Pelvic: External genitalia:  no lesions              Urethra:  normal appearing urethra with no masses, tenderness or lesions              Bartholins and Skenes: normal                 Vagina: normal appearing vagina with normal color and discharge, no lesions              Cervix: no lesions              Pap taken: No. Bimanual Exam:  Uterus:  normal size, contour, position, consistency, mobility, non-tender              Adnexa: no mass, fullness, tenderness              Rectal exam: Yes.  .  Confirms.              Anus:  normal sphincter tone, no lesions  Chaperone was present for exam.  Assessment:   Well woman visit with normal exam. Overactive bladder. HTN. Obesity.  Plan: Mammogram screening discussed. Recommended self breast awareness. Pap and HR HPV as above. Guidelines for Calcium, Vitamin D, regular exercise program including cardiovascular and weight bearing exercise. Referral to PT for bladder control. Declines anticholinergic/antimuscarinic med. Discussed bladder irritants. TDap today.  Labs with PCP.  Contact information for Dr. Leafy Ro to patient.  Follow up annually and prn.  After visit summary provided.

## 2018-02-21 ENCOUNTER — Telehealth: Payer: Self-pay | Admitting: Obstetrics and Gynecology

## 2018-02-21 NOTE — Telephone Encounter (Signed)
Left message on voicemail to call and reschedule cancelled appointment. °

## 2018-02-23 ENCOUNTER — Other Ambulatory Visit: Payer: Self-pay | Admitting: Obstetrics and Gynecology

## 2018-02-23 DIAGNOSIS — Z1231 Encounter for screening mammogram for malignant neoplasm of breast: Secondary | ICD-10-CM

## 2018-02-25 ENCOUNTER — Ambulatory Visit: Payer: Federal, State, Local not specified - PPO | Admitting: Obstetrics and Gynecology

## 2018-03-16 ENCOUNTER — Encounter: Payer: Self-pay | Admitting: Obstetrics and Gynecology

## 2018-03-16 ENCOUNTER — Other Ambulatory Visit (HOSPITAL_COMMUNITY)
Admission: RE | Admit: 2018-03-16 | Discharge: 2018-03-16 | Disposition: A | Discharge status: Home / Self Care | Payer: Medicare HMO | Source: Ambulatory Visit | Attending: Obstetrics and Gynecology | Admitting: Obstetrics and Gynecology

## 2018-03-16 ENCOUNTER — Ambulatory Visit (INDEPENDENT_AMBULATORY_CARE_PROVIDER_SITE_OTHER): Payer: Medicare HMO | Admitting: Obstetrics and Gynecology

## 2018-03-16 VITALS — BP 118/70 | HR 65 | Ht 59.5 in | Wt 242.0 lb

## 2018-03-16 DIAGNOSIS — Z01419 Encounter for gynecological examination (general) (routine) without abnormal findings: Secondary | ICD-10-CM

## 2018-03-16 NOTE — Progress Notes (Signed)
67 y.o. [id number] Single African American female here for annual exam.    Has urinary frequency.  Denies urinary incontinence.  Did physical therapy.  Likes drinking tea.   Denies vaginal bleeding.   Last A1C was 6.   Still working.  PCP:  Dr Kristie Cowman   Patient's last menstrual period was 08/27/2009 (exact date).     Period Cycle (Days): (post menopausal)     Sexually active: No.  The current method of family planning is none.    Exercising: No.  The patient does not participate in regular exercise at present. Smoker:  no  Health Maintenance: Pap:  01/20/2016 normal History of abnormal Pap:  no MMG:  02/15/2018 - BI-RADS CATEGORY  1: Negative.  Scheduled for 03/25/18.  Colonoscopy:  06/26/2014 follow up 10 years BMD:   01/30/2014   Result  Normal.  Scheduled for next week. TDaP:  2018 Gardasil:   no HIV: done 2017 negative Hep C: done 2017 negative Screening Labs:   Labs with PCp.    reports that she has never smoked. She has never used smokeless tobacco. She reports that she does not drink alcohol or use drugs.  Past Medical History:  Diagnosis Date  . Anemia   . Diabetes mellitus without complication ([place])   . Elevated cholesterol   . Hemorrhoids   . Hypertension    chronic  . Menorrhagia 2008   69mon Lupron Tx completed 09/2006    Past Surgical History:  Procedure Laterality Date  . ECTOPIC PREGNANCY SURGERY  1994   Bilat Salping  . ENDOMETRIAL BIOPSY  04/2006    Current Outpatient Medications  Medication Sig Dispense Refill  . atenolol (TENORMIN) 100 MG tablet Take 100 mg by mouth daily.      Marland Kitchen GLIPIZIDE PO Take 10 mg by mouth daily.    Marland Kitchen loratadine (CLARITIN) 10 MG tablet Take 10 mg by mouth daily as needed.  0  . meclizine (ANTIVERT) 50 MG tablet Take 1 tablet (50 mg total) by mouth 3 (three) times daily as needed. 30 tablet 0  . Multiple Vitamin (MULTIVITAMIN) tablet Take 1 tablet by mouth daily.    . ondansetron (ZOFRAN) 4 MG tablet Take 1 tablet  (4 mg total) by mouth every 6 (six) hours. 12 tablet 0  . simvastatin (ZOCOR) 20 MG tablet Take 20 mg by mouth at bedtime.      Marland Kitchen telmisartan-hydrochlorothiazide (MICARDIS HCT) 80-25 MG per tablet Take 1 tablet by mouth daily.       No current facility-administered medications for this visit.     Family History  Problem Relation Age of Onset  . Hypertension Mother   . Hypertension Sister   . Hypertension Maternal Grandmother   . Alcoholism Father   . Colon cancer Neg Hx   . Esophageal cancer Neg Hx   . Rectal cancer Neg Hx   . Stomach cancer Neg Hx   . Breast cancer Neg Hx     Review of Systems  Constitutional: Negative.   HENT: Negative.   Eyes: Negative.   Respiratory: Negative.   Cardiovascular: Negative.   Gastrointestinal: Negative.   Endocrine: Negative.   Genitourinary: Negative.   Musculoskeletal: Negative.   Skin: Negative.   Allergic/Immunologic: Negative.   Neurological: Negative.   Hematological: Negative.   Psychiatric/Behavioral: Negative.   All other systems reviewed and are negative.   Exam:   BP 118/70   Pulse 65   Ht 4' 11.5" (1.511 m)   Wt 242 lb (109.8  kg)   LMP 08/27/2009 (Exact Date)   BMI 48.06 kg/m     General appearance: alert, cooperative and appears stated age Head: Normocephalic, without obvious abnormality, atraumatic Neck: no adenopathy, supple, symmetrical, trachea midline and thyroid normal to inspection and palpation Lungs: clear to auscultation bilaterally Breasts: normal appearance, no masses or tenderness, No nipple retraction or dimpling, No nipple discharge or bleeding, No axillary or supraclavicular adenopathy Heart: regular rate and rhythm Abdomen: obese with pannus.  Abdomen is soft, non-tender; no masses, no organomegaly Extremities: extremities normal, atraumatic, no cyanosis or edema Skin: Skin color, texture, turgor normal. No rashes or lesions Lymph nodes: Cervical, supraclavicular, and axillary nodes normal. No  abnormal inguinal nodes palpated Neurologic: Grossly normal  Pelvic: External genitalia:  no lesions              Urethra:  normal appearing urethra with no masses, tenderness or lesions              Bartholins and Skenes: normal                 Vagina: normal appearing vagina with normal color and discharge, no lesions              Cervix: no lesions              Pap taken: Yes.   Bimanual Exam:  Uterus:  normal size, contour, position, consistency, mobility, non-tender              Adnexa: no mass, fullness, tenderness              Rectal exam: Yes.  .  Confirms.              Anus:  normal sphincter tone, no lesions  Chaperone was present for exam.  Assessment:   Well woman visit with normal exam. Overactive bladder. HTN. Obesity.  Plan: Mammogram screening. Recommended self breast awareness. Pap and HR HPV as above. Guidelines for Calcium, Vitamin D, regular exercise program including cardiovascular and weight bearing exercise. Will join [employer].   We reviewed bladder irritants. Follow up annually and prn.   After visit summary provided.

## 2018-03-16 NOTE — Patient Instructions (Signed)

## 2018-03-18 LAB — CYTOLOGY - PAP: Diagnosis: NEGATIVE

## 2018-03-25 ENCOUNTER — Ambulatory Visit
Admission: RE | Admit: 2018-03-25 | Discharge: 2018-03-25 | Disposition: A | Discharge status: Home / Self Care | Payer: Federal, State, Local not specified - PPO | Source: Ambulatory Visit | Attending: Obstetrics and Gynecology | Admitting: Obstetrics and Gynecology

## 2018-03-25 DIAGNOSIS — Z1231 Encounter for screening mammogram for malignant neoplasm of breast: Secondary | ICD-10-CM

## 2018-03-28 ENCOUNTER — Telehealth: Payer: Self-pay

## 2018-03-28 NOTE — Telephone Encounter (Signed)
-----   Message from Nunzio Cobbs, MD sent at 03/25/2018  4:15 AM EDT ----- Pap normal. Pap recall - 02.

## 2018-03-28 NOTE — Telephone Encounter (Signed)
Informed patient of normal pap results.

## 2019-03-10 ENCOUNTER — Other Ambulatory Visit: Payer: Self-pay | Admitting: Obstetrics and Gynecology

## 2019-03-10 DIAGNOSIS — Z1231 Encounter for screening mammogram for malignant neoplasm of breast: Secondary | ICD-10-CM

## 2019-03-16 ENCOUNTER — Other Ambulatory Visit: Payer: Self-pay

## 2019-03-20 ENCOUNTER — Other Ambulatory Visit: Payer: Self-pay

## 2019-03-20 ENCOUNTER — Ambulatory Visit (INDEPENDENT_AMBULATORY_CARE_PROVIDER_SITE_OTHER): Payer: Medicare HMO | Admitting: Obstetrics and Gynecology

## 2019-03-20 ENCOUNTER — Encounter: Payer: Self-pay | Admitting: Obstetrics and Gynecology

## 2019-03-20 VITALS — BP 112/70 | HR 76 | Temp 97.0°F | Resp 14 | Ht 59.25 in | Wt 242.0 lb

## 2019-03-20 DIAGNOSIS — Z01419 Encounter for gynecological examination (general) (routine) without abnormal findings: Secondary | ICD-10-CM

## 2019-03-20 NOTE — Patient Instructions (Signed)

## 2019-03-20 NOTE — Progress Notes (Signed)
68 y.o. [id number] Single African American female here for annual exam.    She is having hemorrhoids? Ate some cherries and had frequent BMs and anal pain following this.  No bleeding from the hemorrhoids. She is not treating with anything currently.  She had a prescription from her GI in the past.  She will do her flu vaccine in October at [facility].   PCP:   Kristie Cowman, MS  Patient's last menstrual period was 08/27/2009 (exact date).           Sexually active: No.  The current method of family planning is abstinence.    Exercising: No.  The patient does not participate in regular exercise at present. Smoker:  no  Health Maintenance: Pap:  03/16/18 Negative History of abnormal Pap:  no MMG:  03/25/18 BIRADS 1 negative/density b -- scheduled 04/21/19 Colonoscopy:  06/26/2014 follow up 10 years BMD:   2019  Result  Normal with PCP TDaP:  2018 Gardasil:   n/a HIV and Hep C: negative in 2017 Screening Labs:  PCP   reports that she has never smoked. She has never used smokeless tobacco. She reports that she does not drink alcohol or use drugs.  Past Medical History:  Diagnosis Date  . Anemia   . Diabetes mellitus without complication ([place])   . Elevated cholesterol   . Hemorrhoids   . Hypertension    chronic  . Menorrhagia 2008   52mon Lupron Tx completed 09/2006    Past Surgical History:  Procedure Laterality Date  . ECTOPIC PREGNANCY SURGERY  1994   Bilat Salping  . ENDOMETRIAL BIOPSY  04/2006    Current Outpatient Medications  Medication Sig Dispense Refill  . atenolol (TENORMIN) 100 MG tablet Take 100 mg by mouth daily.      Marland Kitchen GLIPIZIDE PO Take 10 mg by mouth daily.    Marland Kitchen loratadine (CLARITIN) 10 MG tablet Take 10 mg by mouth daily as needed.  0  . meclizine (ANTIVERT) 50 MG tablet Take 1 tablet (50 mg total) by mouth 3 (three) times daily as needed. 30 tablet 0  . Multiple Vitamin (MULTIVITAMIN) tablet Take 1 tablet by mouth daily.    . ondansetron (ZOFRAN) 4 MG  tablet Take 1 tablet (4 mg total) by mouth every 6 (six) hours. 12 tablet 0  . simvastatin (ZOCOR) 20 MG tablet Take 20 mg by mouth at bedtime.      Marland Kitchen telmisartan-hydrochlorothiazide (MICARDIS HCT) 80-25 MG per tablet Take 1 tablet by mouth daily.       No current facility-administered medications for this visit.     Family History  Problem Relation Age of Onset  . Hypertension Mother   . Hypertension Sister   . Hypertension Maternal Grandmother   . Alcoholism Father   . Colon cancer Neg Hx   . Esophageal cancer Neg Hx   . Rectal cancer Neg Hx   . Stomach cancer Neg Hx   . Breast cancer Neg Hx     Review of Systems  Constitutional: Negative.   HENT: Negative.   Eyes: Negative.   Respiratory: Negative.   Cardiovascular: Negative.   Gastrointestinal: Negative.   Endocrine: Negative.   Genitourinary: Negative.   Musculoskeletal: Negative.   Skin: Negative.   Allergic/Immunologic: Negative.   Neurological: Negative.   Hematological: Negative.   Psychiatric/Behavioral: Negative.     Exam:   BP 112/70 (BP Location: Right Arm, Patient Position: Sitting, Cuff Size: Large)   Pulse 76   Temp (!) 97  F (36.1 C) (Temporal)   Resp 14   Ht 4' 11.25" (1.505 m)   Wt 242 lb (109.8 kg)   LMP 08/27/2009 (Exact Date)   BMI 48.47 kg/m     General appearance: alert, cooperative and appears stated age Head: normocephalic, without obvious abnormality, atraumatic Neck: no adenopathy, supple, symmetrical, trachea midline and thyroid normal to inspection and palpation Lungs: clear to auscultation bilaterally Breasts: normal appearance, no masses or tenderness, No nipple retraction or dimpling, No nipple discharge or bleeding, No axillary adenopathy Heart: regular rate and regularly irregular rhythm.  Abdomen: soft, non-tender; no masses, no organomegaly Extremities: extremities normal, atraumatic, no cyanosis or edema Skin: skin color, texture, turgor normal. No rashes or lesions Lymph  nodes: cervical, supraclavicular, and axillary nodes normal. Neurologic: grossly normal  Pelvic: External genitalia:  no lesions              No abnormal inguinal nodes palpated.              Urethra:  normal appearing urethra with no masses, tenderness or lesions              Bartholins and Skenes: normal                 Vagina: normal appearing vagina with normal color and discharge, no lesions              Cervix: no lesions              Pap taken: No. Bimanual Exam:  Uterus:  normal size, contour, position, consistency, mobility, non-tender              Adnexa: no mass, fullness, tenderness              Rectal exam: Yes.  .  Confirms.              Anus:  normal sphincter tone, small external hemorrhoid.  Chaperone was present for exam.  Assessment:   Well woman visit with normal exam. Overactive bladder. HTN. Obesity. Irregular heart beat. Asymptomatic.   Hx SVT.   Plan: Mammogram screening discussed. Self breast awareness reviewed. Pap and HR HPV as above. Guidelines for Calcium, Vitamin D, regular exercise program including cardiovascular and weight bearing exercise. She will follow up with her PCP regarding her irregular heart beat. Follow up annually and prn.   After visit summary provided.

## 2019-04-21 ENCOUNTER — Other Ambulatory Visit: Payer: Self-pay

## 2019-04-21 ENCOUNTER — Ambulatory Visit
Admission: RE | Admit: 2019-04-21 | Discharge: 2019-04-21 | Disposition: A | Discharge status: Home / Self Care | Payer: Medicare HMO | Source: Ambulatory Visit | Attending: Obstetrics and Gynecology | Admitting: Obstetrics and Gynecology

## 2019-04-21 DIAGNOSIS — Z1231 Encounter for screening mammogram for malignant neoplasm of breast: Secondary | ICD-10-CM

## 2020-03-20 NOTE — Progress Notes (Signed)
69 y.o. [id number] Single African American female here for annual exam.   Denies vaginal bleeding.   She did pelvic floor therapy for overactive bladder.  Therapy helped her.   A1C 6.3.  Patient has seen PCP 01/24/20 for a f/u of medications.  Received her Covid vaccine.   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  PCP: Kristie Cowman, MD     Patient's last menstrual period was 08/27/2009 (exact date).           Sexually active: No.  The current method of family planning is post menopausal status.    Exercising: No.  The patient does not participate in regular exercise at present. Smoker:  no  Health Maintenance: Pap: 03/16/18 Neg History of abnormal Pap:  no MMG: 04-21-19  3D/Neg/density B/BiRads1 Colonoscopy: 06/26/2014 follow up 10 years BMD: 2019  Result :Normal thru PCP TDaP:  2018 Gardasil:   no HIV: 2017 NR Hep C: 2017 Neg Screening Labs:  PCP Flu vaccine:  Recommended.    reports that she has never smoked. She has never used smokeless tobacco. She reports that she does not drink alcohol and does not use drugs.  Past Medical History:  Diagnosis Date  . Anemia   . Diabetes mellitus without complication ([place])   . Elevated cholesterol   . Hemorrhoids   . Hypertension    chronic  . Menorrhagia 2008   80mon Lupron Tx completed 09/2006    Past Surgical History:  Procedure Laterality Date  . ECTOPIC PREGNANCY SURGERY  1994   Bilat Salping  . ENDOMETRIAL BIOPSY  04/2006    Current Outpatient Medications  Medication Sig Dispense Refill  . atenolol (TENORMIN) 100 MG tablet Take 100 mg by mouth daily.      Marland Kitchen GLIPIZIDE PO Take 10 mg by mouth daily.    Marland Kitchen loratadine (CLARITIN) 10 MG tablet Take 10 mg by mouth daily as needed.  0  . meclizine (ANTIVERT) 50 MG tablet Take 1 tablet (50 mg total) by mouth 3 (three) times daily as needed. 30 tablet 0  . Multiple Vitamin (MULTIVITAMIN) tablet Take 1 tablet by mouth daily.    .  ondansetron (ZOFRAN) 4 MG tablet Take 1 tablet (4 mg total) by mouth every 6 (six) hours. 12 tablet 0  . simvastatin (ZOCOR) 20 MG tablet Take 20 mg by mouth at bedtime.      Marland Kitchen telmisartan-hydrochlorothiazide (MICARDIS HCT) 80-25 MG per tablet Take 1 tablet by mouth daily.       No current facility-administered medications for this visit.    Family History  Problem Relation Age of Onset  . Hypertension Mother   . Hypertension Sister   . Hypertension Maternal Grandmother   . Alcoholism Father   . Colon cancer Neg Hx   . Esophageal cancer Neg Hx   . Rectal cancer Neg Hx   . Stomach cancer Neg Hx   . Breast cancer Neg Hx     Review of Systems  Constitutional: Negative.   HENT: Negative.   Eyes: Negative.   Respiratory: Negative.   Cardiovascular: Negative.   Gastrointestinal: Negative.   Endocrine: Negative.   Genitourinary: Negative.   Musculoskeletal: Negative.   Skin: Negative.   Allergic/Immunologic: Negative.   Neurological: Negative.   Hematological: Negative.   Psychiatric/Behavioral: Negative.     Exam:   BP 118/70 (BP Location: Right Arm, Patient Position: Sitting, Cuff Size: Large)   Pulse 76   Resp  14   Ht 4' 11.5" (1.511 m)   Wt 252 lb (114.3 kg)   LMP 08/27/2009 (Exact Date)   BMI 50.05 kg/m     General appearance: alert, cooperative and appears stated age Head: normocephalic, without obvious abnormality, atraumatic Neck: no adenopathy, supple, symmetrical, trachea midline and thyroid normal to inspection and palpation Lungs: clear to auscultation bilaterally Breasts: normal appearance, no masses or tenderness, No nipple retraction or dimpling, No nipple discharge or bleeding, No axillary adenopathy Heart: regular rate and rhythm Abdomen: soft, non-tender; no masses, no organomegaly Extremities: extremities normal, atraumatic, no cyanosis or edema Skin: skin color, texture, turgor normal. No rashes or lesions Lymph nodes: cervical, supraclavicular, and  axillary nodes normal. Neurologic: grossly normal  Pelvic: External genitalia:  no lesions              No abnormal inguinal nodes palpated.              Urethra:  normal appearing urethra with no masses, tenderness or lesions              Bartholins and Skenes: normal                 Vagina: normal appearing vagina with normal color and discharge, no lesions              Cervix: no lesions              Pap taken: Yes.   Bimanual Exam:  Uterus:  normal size, contour, position, consistency, mobility, non-tender              Adnexa: no mass, fullness, tenderness              Rectal exam: Yes.  .  Confirms.              Anus:  normal sphincter tone, no lesions  Chaperone was present for exam.  Assessment:   Well woman visit with normal exam. Overactive bladder. HTN. Obesity. Hx SVT.   Plan: Mammogram screening discussed. Self breast awareness reviewed. Pap and HR HPV as above. Guidelines for Calcium, Vitamin D, regular exercise program including cardiovascular and weight bearing exercise. We discussed possible PTNS treatment through urology for her bladder if needed.  Follow up annually and prn.   After visit summary provided.

## 2020-03-21 ENCOUNTER — Encounter: Payer: Self-pay | Admitting: Obstetrics and Gynecology

## 2020-03-21 ENCOUNTER — Other Ambulatory Visit: Payer: Self-pay

## 2020-03-21 ENCOUNTER — Ambulatory Visit (INDEPENDENT_AMBULATORY_CARE_PROVIDER_SITE_OTHER): Payer: Medicare HMO | Admitting: Obstetrics and Gynecology

## 2020-03-21 ENCOUNTER — Other Ambulatory Visit (HOSPITAL_COMMUNITY)
Admission: RE | Admit: 2020-03-21 | Discharge: 2020-03-21 | Disposition: A | Discharge status: Home / Self Care | Payer: Medicare HMO | Source: Ambulatory Visit | Attending: Obstetrics and Gynecology | Admitting: Obstetrics and Gynecology

## 2020-03-21 VITALS — BP 118/70 | HR 76 | Resp 14 | Ht 59.5 in | Wt 252.0 lb

## 2020-03-21 DIAGNOSIS — Z01419 Encounter for gynecological examination (general) (routine) without abnormal findings: Secondary | ICD-10-CM | POA: Diagnosis not present

## 2020-03-21 NOTE — Patient Instructions (Signed)

## 2020-03-22 LAB — CYTOLOGY - PAP: Diagnosis: NEGATIVE

## 2021-03-27 ENCOUNTER — Ambulatory Visit: Payer: Medicare HMO | Admitting: Obstetrics and Gynecology

## 2021-05-08 ENCOUNTER — Encounter: Payer: Self-pay | Admitting: Obstetrics and Gynecology

## 2021-05-08 ENCOUNTER — Other Ambulatory Visit: Payer: Self-pay

## 2021-05-08 ENCOUNTER — Ambulatory Visit (INDEPENDENT_AMBULATORY_CARE_PROVIDER_SITE_OTHER): Payer: Medicare HMO | Admitting: Obstetrics and Gynecology

## 2021-05-08 VITALS — BP 108/64 | HR 64 | Resp 14 | Ht 59.25 in | Wt 247.0 lb

## 2021-05-08 DIAGNOSIS — Z01419 Encounter for gynecological examination (general) (routine) without abnormal findings: Secondary | ICD-10-CM

## 2021-05-08 DIAGNOSIS — N3281 Overactive bladder: Secondary | ICD-10-CM

## 2021-05-08 MED ORDER — OXYBUTYNIN CHLORIDE ER 10 MG PO TB24: ORAL_TABLET | ORAL | 1 refills | Status: DC

## 2021-05-08 NOTE — Progress Notes (Signed)
70 y.o. [id number] Single African American female here for annual exam.    No vaginal bleeding or spotting.   Has urinary frequency.  Some urgency and leakage. Empties all the time during the day.  Up once a night.  No leak with laugh, cough, or sneeze.   She did physical therapy and not sure if it worked for her.   She is being monitored by her eye doctor for eye changes.  No current glaucoma.   A1C is prediabetic range, around 6.3.  PCP: Kristie Cowman, MD    Patient's last menstrual period was 08/27/2009 (exact date).           Sexually active: No.  The current method of family planning is post menopausal status.    Exercising: No.  The patient does not participate in regular exercise at present. Smoker:  no  Health Maintenance: Pap:  03-21-20 Neg, 03/16/18 Neg, 01-20-16 Neg:Neg HR HPV History of abnormal Pap:  no MMG:  04-21-19 3D/Neg/BiRads1--info to pt.to schedule Colonoscopy:  06/26/2014 follow up 10 years BMD:  2019  Result :Normal w/PCP TDaP:  2018 Gardasil:   no HIV:2017 NR Hep C: 2017 Neg Screening Labs:  PCP   reports that she has never smoked. She has never used smokeless tobacco. She reports that she does not drink alcohol and does not use drugs.  Past Medical History:  Diagnosis Date   Anemia    Diabetes mellitus without complication (HCC)    Elevated cholesterol    Hemorrhoids    Hypertension    chronic   Menorrhagia 2008   40mon Lupron Tx completed 09/2006    Past Surgical History:  Procedure Laterality Date   ECTOPIC PREGNANCY SURGERY  1994   Bilat Salping   ENDOMETRIAL BIOPSY  04/2006    Current Outpatient Medications  Medication Sig Dispense Refill   atenolol (TENORMIN) 100 MG tablet Take 100 mg by mouth daily.       glipiZIDE (GLUCOTROL XL) 10 MG 24 hr tablet Take 10 mg by mouth daily.     loratadine (CLARITIN) 10 MG tablet Take 10 mg by mouth daily as needed.  0   Multiple Vitamin (MULTIVITAMIN) tablet Take 1 tablet by mouth daily.      simvastatin (ZOCOR) 20 MG tablet Take 20 mg by mouth at bedtime.       telmisartan-hydrochlorothiazide (MICARDIS HCT) 80-25 MG per tablet Take 1 tablet by mouth daily.       Vitamin D, Ergocalciferol, (DRISDOL) 1.25 MG (50000 UNIT) CAPS capsule Take 50,000 Units by mouth once a week.     No current facility-administered medications for this visit.    Family History  Problem Relation Age of Onset   Hypertension Mother    Hypertension Sister    Hypertension Maternal Grandmother    Alcoholism Father    Colon cancer Neg Hx    Esophageal cancer Neg Hx    Rectal cancer Neg Hx    Stomach cancer Neg Hx    Breast cancer Neg Hx     Review of Systems  All other systems reviewed and are negative.  Exam:   BP 108/64   Pulse 64   Resp 14   Ht 4' 11.25" (1.505 m)   Wt 247 lb (112 kg)   LMP 08/27/2009 (Exact Date)   BMI 49.47 kg/m     General appearance: alert, cooperative and appears stated age Head: normocephalic, without obvious abnormality, atraumatic Neck: no adenopathy, supple, symmetrical, trachea midline and thyroid normal to inspection  and palpation Lungs: clear to auscultation bilaterally Breasts: normal appearance, no masses or tenderness, No nipple retraction or dimpling, No nipple discharge or bleeding, No axillary adenopathy Heart: regular rate and rhythm Abdomen: soft, non-tender; no masses, no organomegaly Extremities: extremities normal, atraumatic, no cyanosis or edema Skin: skin color, texture, turgor normal. No rashes or lesions Lymph nodes: cervical, supraclavicular, and axillary nodes normal. Neurologic: grossly normal  Pelvic: External genitalia:  no lesions              No abnormal inguinal nodes palpated.              Urethra:  normal appearing urethra with no masses, tenderness or lesions              Bartholins and Skenes: normal                 Vagina: normal appearing vagina with normal color and discharge, no lesions              Cervix: no lesions               Pap taken: no Bimanual Exam:  Uterus:  normal size, contour, position, consistency, mobility, non-tender              Adnexa: no mass, fullness, tenderness              Rectal exam: yes.  Confirms.              Anus:  normal sphincter tone, no lesions  Chaperone was present for exam:  yes  Assessment:   Well woman visit with gynecologic exam. Overactive bladder.  HTN.  Hx SVT.   Plan: Mammogram screening discussed. Self breast awareness reviewed. Pap and HR HPV as above. Guidelines for Calcium, Vitamin D, regular exercise program including cardiovascular and weight bearing exercise. Avoid bladder irritants.  Start Ditropan XL 10 mg daily.  I discussed potential side effects.  We also discussed bladder irritants to avoid.  Fu in 6 weeks in office.   After visit summary provided.   22 min  total time was spent for this patient encounter, including preparation, face-to-face counseling with the patient, coordination of care, and documentation of the encounter.

## 2021-05-08 NOTE — Patient Instructions (Signed)
EXERCISE AND DIET:  We recommended that you start or continue a regular exercise program for good health. Regular exercise means any activity that makes your heart beat faster and makes you sweat.  We recommend exercising at least 30 minutes per day at least 3 days a week, preferably 4 or 5.  We also recommend a diet low in fat and sugar.  Inactivity, poor dietary choices and obesity can cause diabetes, heart attack, stroke, and kidney damage, among others.    ALCOHOL AND SMOKING:  Women should limit their alcohol intake to no more than 7 drinks/beers/glasses of wine (combined, not each!) per week. Moderation of alcohol intake to this level decreases your risk of breast cancer and liver damage. And of course, no recreational drugs are part of a healthy lifestyle.  And absolutely no smoking or even second hand smoke. Most people know smoking can cause heart and lung diseases, but did you know it also contributes to weakening of your bones? Aging of your skin?  Yellowing of your teeth and nails?  CALCIUM AND VITAMIN D:  Adequate intake of calcium and Vitamin D are recommended.  The recommendations for exact amounts of these supplements seem to change often, but generally speaking 600 mg of calcium (either carbonate or citrate) and 800 units of Vitamin D per day seems prudent. Certain women may benefit from higher intake of Vitamin D.  If you are among these women, your doctor will have told you during your visit.    PAP SMEARS:  Pap smears, to check for cervical cancer or precancers,  have traditionally been done yearly, although recent scientific advances have shown that most women can have pap smears less often.  However, every woman still should have a physical exam from her gynecologist every year. It will include a breast check, inspection of the vulva and vagina to check for abnormal growths or skin changes, a visual exam of the cervix, and then an exam to evaluate the size and shape of the uterus and  ovaries.  And after 68 years of age, a rectal exam is indicated to check for rectal cancers. We will also provide age appropriate advice regarding health maintenance, like when you should have certain vaccines, screening for sexually transmitted diseases, bone density testing, colonoscopy, mammograms, etc.   MAMMOGRAMS:  All women over 40 years old should have a yearly mammogram. Many facilities now offer a "3D" mammogram, which may cost around $50 extra out of pocket. If possible,  we recommend you accept the option to have the 3D mammogram performed.  It both reduces the number of women who will be called back for extra views which then turn out to be normal, and it is better than the routine mammogram at detecting truly abnormal areas.    COLONOSCOPY:  Colonoscopy to screen for colon cancer is recommended for all women at age 50.  We know, you hate the idea of the prep.  We agree, BUT, having colon cancer and not knowing it is worse!!  Colon cancer so often starts as a polyp that can be seen and removed at colonscopy, which can quite literally save your life!  And if your first colonoscopy is normal and you have no family history of colon cancer, most women don't have to have it again for 10 years.  Once every ten years, you can do something that may end up saving your life, right?  We will be happy to help you get it scheduled when you are ready.    Be sure to check your insurance coverage so you understand how much it will cost.  It may be covered as a preventative service at no cost, but you should check your particular policy.    Oxybutynin Extended-Release Tablets What is this medication? OXYBUTYNIN (ox i BYOO ti nin) treats symptoms of an overactive bladder, such as loss of bladder control or frequent need to urinate. It works by relaxing muscles in the bladder. It belongs to a group of medications called antispasmodics. This medicine may be used for other purposes; ask your health care provider or  pharmacist if you have questions. COMMON BRAND NAME(S): Ditropan XL What should I tell my care team before I take this medication? They need to know if you have any of these conditions: Autonomic neuropathy Dementia Difficulty passing urine Glaucoma Intestinal obstruction Kidney disease Liver disease Myasthenia gravis Parkinson's disease An unusual or allergic reaction to oxybutynin, other medications, foods, dyes, or preservatives Pregnant or trying to get pregnant Breast-feeding How should I use this medication? Take this medication by mouth with a glass of water. Swallow whole, do not crush, cut, or chew. Follow the directions on the prescription label. You can take this medication with or without food. Take your doses at regular intervals. Do not take your medication more often than directed. Talk to your care team about the use of this medication in children. Special care may be needed. While this medication may be prescribed for children as young as 6 years for selected conditions, precautions do apply. Overdosage: If you think you have taken too much of this medicine contact a poison control center or emergency room at once. NOTE: This medicine is only for you. Do not share this medicine with others. What if I miss a dose? If you miss a dose, take it as soon as you can. If it is almost time for your next dose, take only that dose. Do not take double or extra doses. What may interact with this medication? Antihistamines for allergy, cough and cold Atropine Certain medications for bladder problems like oxybutynin, tolterodine Certain medications for Parkinson's disease like benztropine, trihexyphenidyl Certain medications for stomach problems like dicyclomine, hyoscyamine Certain medications for travel sickness like scopolamine Clarithromycin Erythromycin Ipratropium Medications for fungal infections, like fluconazole, itraconazole, ketoconazole or voriconazole This list may  not describe all possible interactions. Give your health care provider a list of all the medicines, herbs, non-prescription drugs, or dietary supplements you use. Also tell them if you smoke, drink alcohol, or use illegal drugs. Some items may interact with your medicine. What should I watch for while using this medication? It may take a few weeks to notice the full benefit from this medication. You may need to limit your intake tea, coffee, caffeinated sodas, and alcohol. These drinks may make your symptoms worse. You may get drowsy or dizzy. Do not drive, use machinery, or do anything that needs mental alertness until you know how this medication affects you. Do not stand or sit up quickly, especially if you are an older patient. This reduces the risk of dizzy or fainting spells. Alcohol may interfere with the effect of this medication. Avoid alcoholic drinks. Your mouth may get dry. Chewing sugarless gum or sucking hard candy, and drinking plenty of water may help. Contact your care team if the problem does not go away or is severe. This medication may cause dry eyes and blurred vision. If you wear contact lenses, you may feel some discomfort. Lubricating drops may help. See  your eyecare professional if the problem does not go away or is severe. You may notice the shells of the tablets in your stool from time to time. This is normal. Avoid extreme heat. This medication can cause you to sweat less than normal. Your body temperature could increase to dangerous levels, which may lead to heat stroke. What side effects may I notice from receiving this medication? Side effects that you should report to your care team as soon as possible: Allergic reactions or angioedema--skin rash, itching, hives, swelling of the face, eyes, lips, tongue, arms, or legs, trouble swallowing or breathing Sudden eye pain or change in vision such as blurry vision, seeing halos around lights, vision loss Trouble passing  urine Side effects that usually do not require medical attention (report to your care team if they continue or are bothersome): Confusion Constipation Dizziness Drowsiness Dry mouth Headache This list may not describe all possible side effects. Call your doctor for medical advice about side effects. You may report side effects to FDA at 1-800-FDA-1088. Where should I keep my medication? Keep out of the reach of children. Store at room temperature between 15 and 30 degrees C (59 and 86 degrees F). Protect from moisture and humidity. Throw away any unused medication after the expiration date. NOTE: This sheet is a summary. It may not cover all possible information. If you have questions about this medicine, talk to your doctor, pharmacist, or health care provider.  2022 Elsevier/Gold Standard (2020-10-30 00:00:00)

## 2021-06-10 ENCOUNTER — Other Ambulatory Visit: Payer: Self-pay | Admitting: Obstetrics and Gynecology

## 2021-06-10 DIAGNOSIS — Z1231 Encounter for screening mammogram for malignant neoplasm of breast: Secondary | ICD-10-CM

## 2021-06-11 NOTE — Progress Notes (Signed)
GYNECOLOGY  VISIT   HPI: 70 y.o.   Single  African American  female   [phone] with Patient's last menstrual period was 08/27/2009 (exact date).   here for  6 week follow up of ditropan xl use.   Voiding every 2 - 3 hours during the day instead of hourly.  Up twice a night to void.  Can have incontinence if she does not put herself on a voiding schedule.  Voiding well.   Mild dry mouth.  No constipation.   Likes black tea and sweet tea.  Also enjoys lemonade.   GYNECOLOGIC HISTORY: Patient's last menstrual period was 08/27/2009 (exact date). Contraception:  post menopausal Menopausal hormone therapy:  none Last mammogram:  04-21-19 category b density birads 1:neg, scheduled for 07-11-21 Last pap smear:   03-21-20 neg, 03-16-18 neg        OB History     Gravida  4   Para  2   Term  1   Preterm  1   AB  2   Living  1      SAB  0   IAB  1   Ectopic  1   Multiple  0   Live Births  1              Patient Active Problem List   Diagnosis Date Noted   HEMORRHOIDS, INTERNAL 08/07/2009   DYSLIPIDEMIA 03/26/2009   HYPERTENSION 03/26/2009   IRON DEFICIENCY ANEMIA, HX OF 03/26/2009   COLONIC POLYPS, HYPERPLASTIC, HX OF 03/26/2009    Past Medical History:  Diagnosis Date   Anemia    Diabetes mellitus without complication ([place])    Elevated cholesterol    Hemorrhoids    Hypertension    chronic   Menorrhagia 2008   47mon Lupron Tx completed 09/2006    Past Surgical History:  Procedure Laterality Date   ECTOPIC PREGNANCY SURGERY  1994   Bilat Salping   ENDOMETRIAL BIOPSY  04/2006    Current Outpatient Medications  Medication Sig Dispense Refill   atenolol (TENORMIN) 100 MG tablet Take 100 mg by mouth daily.       glipiZIDE (GLUCOTROL XL) 10 MG 24 hr tablet Take 10 mg by mouth daily.     loratadine (CLARITIN) 10 MG tablet Take 10 mg by mouth daily as needed.  0   Multiple Vitamin (MULTIVITAMIN) tablet Take 1 tablet by mouth daily.     oxybutynin  (DITROPAN XL) 10 MG 24 hr tablet Take one tablet (10 mg) by mouth daily. 30 tablet 1   simvastatin (ZOCOR) 20 MG tablet Take 20 mg by mouth at bedtime.       telmisartan-hydrochlorothiazide (MICARDIS HCT) 80-25 MG per tablet Take 1 tablet by mouth daily.       Vitamin D, Ergocalciferol, (DRISDOL) 1.25 MG (50000 UNIT) CAPS capsule Take 50,000 Units by mouth once a week.     No current facility-administered medications for this visit.     ALLERGIES: Patient has no known allergies.  Family History  Problem Relation Age of Onset   Hypertension Mother    Hypertension Sister    Hypertension Maternal Grandmother    Alcoholism Father    Colon cancer Neg Hx    Esophageal cancer Neg Hx    Rectal cancer Neg Hx    Stomach cancer Neg Hx    Breast cancer Neg Hx     Social History   Socioeconomic History   Marital status: Single    Spouse name: Not on  file   Number of children: 1   Years of education: Not on file   Highest education level: Not on file  Occupational History   Not on file  Tobacco Use   Smoking status: Never   Smokeless tobacco: Never  Vaping Use   Vaping Use: Never used  Substance and Sexual Activity   Alcohol use: No   Drug use: No   Sexual activity: Not Currently    Partners: Male    Birth control/protection: Post-menopausal  Other Topics Concern   Not on file  Social History Narrative   She works in [employer] from 7:30 - 11:30 am   Social Determinants of Radio broadcast assistant Strain: Not on file  Food Insecurity: Not on file  Transportation Needs: Not on file  Physical Activity: Not on file  Stress: Not on file  Social Connections: Not on file  Intimate Partner Violence: Not on file    Review of Systems  Constitutional: Negative.   HENT: Negative.    Eyes: Negative.   Respiratory: Negative.    Cardiovascular: Negative.   Gastrointestinal: Negative.   Endocrine: Negative.   Genitourinary: Negative.   Musculoskeletal: Negative.    Skin: Negative.   Allergic/Immunologic: Negative.   Neurological: Negative.   Hematological: Negative.   Psychiatric/Behavioral: Negative.     PHYSICAL EXAMINATION:    BP 118/78    Pulse 84    Resp 16    Wt 244 lb (110.7 kg)    LMP 08/27/2009 (Exact Date)    BMI 48.87 kg/m     General appearance: alert, cooperative and appears stated age  ASSESSMENT  Overactive bladder.  Improved on Ditropan XL.  Medication monitoring.     PLAN  Will switch to Ditropan 5 mg po tid. She will let me know how she is doing on this new regimen.  We discussed bladder irritants.  Fu prn.    An After Visit Summary was printed and given to the patient.  27 min  total time was spent for this patient encounter, including preparation, face-to-face counseling with the patient, coordination of care, and documentation of the encounter.

## 2021-06-20 ENCOUNTER — Other Ambulatory Visit: Payer: Self-pay | Admitting: Obstetrics and Gynecology

## 2021-06-24 ENCOUNTER — Other Ambulatory Visit: Payer: Self-pay

## 2021-06-24 ENCOUNTER — Ambulatory Visit: Payer: Medicare HMO | Admitting: Obstetrics and Gynecology

## 2021-06-24 ENCOUNTER — Encounter: Payer: Self-pay | Admitting: Obstetrics and Gynecology

## 2021-06-24 VITALS — BP 118/78 | HR 84 | Resp 16 | Wt 244.0 lb

## 2021-06-24 DIAGNOSIS — N3281 Overactive bladder: Secondary | ICD-10-CM | POA: Diagnosis not present

## 2021-06-24 DIAGNOSIS — Z5181 Encounter for therapeutic drug level monitoring: Secondary | ICD-10-CM | POA: Diagnosis not present

## 2021-06-24 MED ORDER — OXYBUTYNIN CHLORIDE 5 MG PO TABS: 5.0000 mg | ORAL_TABLET | Freq: Three times a day (TID) | ORAL | 1 refills | Status: DC

## 2021-07-11 ENCOUNTER — Ambulatory Visit: Payer: Medicare HMO

## 2021-07-17 ENCOUNTER — Other Ambulatory Visit: Payer: Self-pay | Admitting: *Deleted

## 2021-07-31 ENCOUNTER — Ambulatory Visit: Payer: Medicare HMO

## 2021-08-05 ENCOUNTER — Ambulatory Visit
Admission: RE | Admit: 2021-08-05 | Discharge: 2021-08-05 | Disposition: A | Discharge status: Home / Self Care | Payer: Medicare HMO | Source: Ambulatory Visit | Attending: Obstetrics and Gynecology | Admitting: Obstetrics and Gynecology

## 2021-08-05 DIAGNOSIS — Z1231 Encounter for screening mammogram for malignant neoplasm of breast: Secondary | ICD-10-CM

## 2021-10-29 ENCOUNTER — Other Ambulatory Visit: Payer: Self-pay | Admitting: Obstetrics and Gynecology

## 2021-10-29 NOTE — Telephone Encounter (Signed)
Medication refill request: oxybutynin  ?Last AEX:  05-08-21 BS ?Next AEX: 05-11-22 ?Last MMG (if hormonal medication request): n/a ?Refill authorized: Today, please advise.  ? ?Medication pended for #90, 1RF. Please refill if appropriate.  ? ?

## 2021-11-21 ENCOUNTER — Other Ambulatory Visit: Payer: Self-pay | Admitting: Obstetrics and Gynecology

## 2021-11-21 NOTE — Telephone Encounter (Signed)
Refused due to Rx refill request too soon. Pt Rxd #90 with one additional refill on 10/29/21. If taking correctly, shouldn't need another fill until end of 11/2021.

## 2022-01-01 ENCOUNTER — Other Ambulatory Visit: Payer: Self-pay | Admitting: Obstetrics and Gynecology

## 2022-01-01 NOTE — Telephone Encounter (Signed)
Last AEX 05/08/21--scheduled for 05/11/22.

## 2022-04-05 ENCOUNTER — Other Ambulatory Visit: Payer: Self-pay | Admitting: Obstetrics and Gynecology

## 2022-05-11 ENCOUNTER — Encounter: Payer: Self-pay | Admitting: Obstetrics and Gynecology

## 2022-05-11 ENCOUNTER — Ambulatory Visit (INDEPENDENT_AMBULATORY_CARE_PROVIDER_SITE_OTHER): Payer: Medicare HMO | Admitting: Obstetrics and Gynecology

## 2022-05-11 VITALS — BP 124/80 | HR 78 | Ht 60.0 in | Wt 231.0 lb

## 2022-05-11 DIAGNOSIS — Z5181 Encounter for therapeutic drug level monitoring: Secondary | ICD-10-CM | POA: Diagnosis not present

## 2022-05-11 DIAGNOSIS — N3946 Mixed incontinence: Secondary | ICD-10-CM | POA: Diagnosis not present

## 2022-05-11 MED ORDER — OXYBUTYNIN CHLORIDE 5 MG PO TABS: 5.0000 mg | ORAL_TABLET | Freq: Three times a day (TID) | ORAL | 3 refills | Status: DC

## 2022-05-11 NOTE — Progress Notes (Signed)
71 y.o. [id number] Single African American female here for overactive bladder.   She is taking Ditropan 5 mg po tid.   DF is not often, but she still has some urgency.  Voiding every 2 - 3 hours.  NF is maybe once a night. She does leak with she sits up at night to void.  She feels her urine  is more controlled.  She does wear a pad.  Has dry mouth.   She uses a mouth moisturizer and sugar free candy.   Has leak with cough, sneeze or laugh every now and then.   Sometimes leans forward to finish voiding.  No known vaginal bulge.   Some constipation and uses a stool softener occasionally.   Her PCP is following her diabetes.  A1C was 6.2 last year.  She is waiting for results from her recent check.   SVT is well controlled.   She does not have glaucoma.   She enjoys sweet tea.  She has done pelvic floor therapy in the past.   PCP:  Kristie Cowman, MD   Patient's last menstrual period was 08/27/2009 (exact date).           Sexually active: No.  The current method of family planning is post menopausal status.    Exercising: No.     Smoker:  no  Health Maintenance: Pap:03-21-20 Neg, 03/16/18 Neg, 01-20-16 Neg:Neg HR HPV    History of abnormal Pap:  no MMG:  08/05/21 BI- RADS CATEGORY 1 NEG Colonoscopy:  06/26/14   reports that she has never smoked. She has never used smokeless tobacco. She reports that she does not drink alcohol and does not use drugs.  Past Medical History:  Diagnosis Date   Anemia    Diabetes mellitus without complication (HCC)    Elevated cholesterol    Hemorrhoids    Hypertension    chronic   Menorrhagia 2008   39mo Lupron Tx completed 09/2006    Past Surgical History:  Procedure Laterality Date   ECTOPIC PREGNANCY SURGERY  1994   Bilat Salping   ENDOMETRIAL BIOPSY  04/2006    Current Outpatient Medications  Medication Sig Dispense Refill   atenolol (TENORMIN) 100 MG tablet Take 100 mg by mouth daily.       glipiZIDE (GLUCOTROL XL) 10 MG 24  hr tablet Take 10 mg by mouth daily.     loratadine (CLARITIN) 10 MG tablet Take 10 mg by mouth daily as needed.  0   Multiple Vitamin (MULTIVITAMIN) tablet Take 1 tablet by mouth daily.     oxybutynin (DITROPAN) 5 MG tablet TAKE 1 TABLET BY MOUTH THREE TIMES A DAY 90 tablet 3   simvastatin (ZOCOR) 20 MG tablet Take 20 mg by mouth at bedtime.       telmisartan-hydrochlorothiazide (MICARDIS HCT) 80-25 MG per tablet Take 1 tablet by mouth daily.       Vitamin D, Ergocalciferol, (DRISDOL) 1.25 MG (50000 UNIT) CAPS capsule Take 50,000 Units by mouth once a week.     No current facility-administered medications for this visit.    Family History  Problem Relation Age of Onset   Hypertension Mother    Hypertension Sister    Hypertension Maternal Grandmother    Alcoholism Father    Colon cancer Neg Hx    Esophageal cancer Neg Hx    Rectal cancer Neg Hx    Stomach cancer Neg Hx    Breast cancer Neg Hx     Review of Systems  All other systems reviewed and are negative.   Exam:   BP 124/80 (BP Location: Right Arm, Patient Position: Sitting, Cuff Size: Large)   Pulse 78   Ht 5' (1.524 m)   Wt 231 lb (104.8 kg)   LMP 08/27/2009 (Exact Date)   BMI 45.11 kg/m     General appearance: alert, cooperative and appears stated age Head: normocephalic, without obvious abnormality, atraumatic Lungs: clear to auscultation bilaterally Heart: regular rate and rhythm Abdomen: soft, non-tender; no masses, no organomegaly Extremities: extremities normal, atraumatic, no cyanosis or edema  Pelvic:  Declined by patient.   Assessment:   Mixed incontinence.  Encounter for medication monitoring.  HTN.  Hx SVT.   Plan: Continue with Ditropan 5 mg po tid.  #270, RF: 3.  (Patient requested a 90 day Rx.) Side effects reviewed including dry mouth, constipation, and urinary retention.   Bladder irritants discussed.  She declines return to pelvic floor therapy.  Good blood sugar control encouraged.   Weight loss for improved stress incontinence reviewed.  Follow up in one year for breast/pelvic/pap and office visit.   After visit summary provided.   32  total time was spent for this patient encounter, including preparation, face-to-face counseling with the patient, coordination of care, and documentation of the encounter.

## 2022-05-11 NOTE — Patient Instructions (Signed)

## 2023-04-14 ENCOUNTER — Other Ambulatory Visit: Payer: Self-pay | Admitting: Obstetrics and Gynecology

## 2023-04-14 DIAGNOSIS — N3946 Mixed incontinence: Secondary | ICD-10-CM

## 2023-04-15 NOTE — Telephone Encounter (Signed)
Med refill request: oxybutynin 5mg  #270 Last OV: 05/11/22 Last AEX: 05/11/21 Next AEX: 05/10/23 Last MMG (if hormonal med) n/a Refill authorized: oxybutynin 5mg  #270. Sent to provider for review.

## 2023-04-26 NOTE — Progress Notes (Signed)
72 y.o. [id number] Single African American female here for breast and pelvic exam.   Patient is followed for overactive bladder. On Ditropan. Has leakage of her bladder but not once a day. Sometimes she waits too long to get up to use the bathroom.  Up once a night to void.  Little dry mouth.  Voiding well.   No dx of glaucoma.   Lost about 30 pounds without making any effort.   No current partner.   PCP: Knox Royalty, MD   Patient's last menstrual period was 08/27/2009 (exact date).           Sexually active: No.  The current method of family planning is post menopausal status.    Exercising: No.     Smoker:  no  OB History  Gravida Para Term Preterm AB Living  4 2 1 1 2 1   SAB IAB Ectopic Multiple Live Births  0 1 1 0 1    # Outcome Date GA Lbr Len/2nd Weight Sex Type Anes PTL Lv  4 Term 1974 [redacted]w[redacted]d   F Vag-Spont  N LIV  3 Preterm         FD     Birth Comments: stillborn  2 Ectopic           1 IAB              Health Maintenance: Pap:  03/21/20 neg, 03/16/18 neg History of abnormal Pap:  no MMG: 08/05/21 Breast Density Cat A, BI-RADS CAT 1 neg Colonoscopy:   HM Colonoscopy          Colonoscopy (Every 10 Years) Next due on 06/26/2024    06/26/2014  COLONOSCOPY   Only the first 1 history entries have been loaded, but more history exists.           BMD:  12/15/22 at [facility] per patient - normal.   HIV: 01/20/16 NR Hep C: 01/20/16 neg  Immunization History  Administered Date(s) Administered   Fluad Quad(high Dose 65+) 04/02/2019   PFIZER(Purple Top)SARS-COV-2 Vaccination 07/21/2019, 08/11/2019   Tdap 06/29/2006, 02/16/2017      reports that she has never smoked. She has never used smokeless tobacco. She reports that she does not drink alcohol and does not use drugs.  Past Medical History:  Diagnosis Date   Anemia    Diabetes mellitus without complication (HCC)    Elevated cholesterol    Hemorrhoids    Hypertension    chronic   Menorrhagia  2008   6mon Lupron Tx completed 09/2006    Past Surgical History:  Procedure Laterality Date   ECTOPIC PREGNANCY SURGERY  1994   Bilat Salping   ENDOMETRIAL BIOPSY  04/2006    Current Outpatient Medications  Medication Sig Dispense Refill   atenolol (TENORMIN) 100 MG tablet Take 100 mg by mouth daily.       glipiZIDE (GLUCOTROL XL) 10 MG 24 hr tablet Take 10 mg by mouth daily.     loratadine (CLARITIN) 10 MG tablet Take 10 mg by mouth daily as needed.  0   Multiple Vitamin (MULTIVITAMIN) tablet Take 1 tablet by mouth daily.     oxybutynin (DITROPAN) 5 MG tablet TAKE 1 TABLET BY MOUTH THREE TIMES A DAY 90 tablet 0   simvastatin (ZOCOR) 20 MG tablet Take 20 mg by mouth at bedtime.       telmisartan-hydrochlorothiazide (MICARDIS HCT) 80-25 MG per tablet Take 1 tablet by mouth daily.       Vitamin D,  Ergocalciferol, (DRISDOL) 1.25 MG (50000 UNIT) CAPS capsule Take 50,000 Units by mouth once a week.     No current facility-administered medications for this visit.    Family History  Problem Relation Age of Onset   Hypertension Mother    Hypertension Sister    Hypertension Maternal Grandmother    Alcoholism Father    Colon cancer Neg Hx    Esophageal cancer Neg Hx    Rectal cancer Neg Hx    Stomach cancer Neg Hx    Breast cancer Neg Hx     Review of Systems  All other systems reviewed and are negative.   Exam:   BP 120/84 (BP Location: Right Arm, Patient Position: Sitting, Cuff Size: Large)   Ht 4\' 11"  (1.499 m)   Wt 203 lb (92.1 kg)   LMP 08/27/2009 (Exact Date)   BMI 41.00 kg/m     General appearance: alert, cooperative and appears stated age Head: normocephalic, without obvious abnormality, atraumatic Neck: no adenopathy, supple, symmetrical, trachea midline and thyroid normal to inspection and palpation Lungs: clear to auscultation bilaterally Breasts: normal appearance, no masses or tenderness, No nipple retraction or dimpling, No nipple discharge or bleeding, No  axillary adenopathy Heart: regular rate and rhythm Abdomen: soft, non-tender; no masses, no organomegaly Extremities: extremities normal, atraumatic, no cyanosis or edema Skin: skin color, texture, turgor normal. No rashes or lesions Lymph nodes: cervical, supraclavicular, and axillary nodes normal. Neurologic: grossly normal  Pelvic: External genitalia:  no lesions              No abnormal inguinal nodes palpated.              Urethra:  normal appearing urethra with no masses, tenderness or lesions              Bartholins and Skenes: normal                 Vagina: normal appearing vagina with normal color and discharge, no lesions              Cervix: no lesions              Pap taken:yes Bimanual Exam:  Uterus:  normal size, contour, position, consistency, mobility, non-tender              Adnexa: no mass, fullness, tenderness              Rectal exam: yes.  Confirms.              Anus:  normal sphincter tone, no lesions  Chaperone was present for exam:  Warren Lacy, CMA   Assessment:  Encounter for breast and pelvic exam.  Cervical cancer screening.  Overactive bladder.  Encounter for medication monitoring.  HTN.  Hx SVT.   Plan:  Mammogram screening discussed.  She will schedule.  Self breast awareness reviewed. Pap and HR HPV collected.  Refill of Ditropan. Guidelines for Calcium, Vitamin D, regular exercise program including cardiovascular and weight bearing exercise. FU yearly and prn.   20 min  total time was spent for this patient encounter, including preparation, face-to-face counseling with the patient, coordination of care, and documentation of the encounter in addition to doing the breast and pelvic exam and pap.

## 2023-05-09 ENCOUNTER — Other Ambulatory Visit: Payer: Self-pay | Admitting: Obstetrics and Gynecology

## 2023-05-09 DIAGNOSIS — N3946 Mixed incontinence: Secondary | ICD-10-CM

## 2023-05-10 ENCOUNTER — Ambulatory Visit: Payer: Medicare HMO | Admitting: Obstetrics and Gynecology

## 2023-05-10 ENCOUNTER — Other Ambulatory Visit (HOSPITAL_COMMUNITY)
Admission: RE | Admit: 2023-05-10 | Discharge: 2023-05-10 | Disposition: A | Discharge status: Home / Self Care | Payer: Medicare HMO | Source: Ambulatory Visit | Attending: Obstetrics and Gynecology | Admitting: Obstetrics and Gynecology

## 2023-05-10 ENCOUNTER — Other Ambulatory Visit: Payer: Self-pay | Admitting: Obstetrics and Gynecology

## 2023-05-10 ENCOUNTER — Encounter: Payer: Self-pay | Admitting: Obstetrics and Gynecology

## 2023-05-10 VITALS — BP 120/84 | Ht 59.0 in | Wt 203.0 lb

## 2023-05-10 DIAGNOSIS — Z01419 Encounter for gynecological examination (general) (routine) without abnormal findings: Secondary | ICD-10-CM

## 2023-05-10 DIAGNOSIS — Z5181 Encounter for therapeutic drug level monitoring: Secondary | ICD-10-CM

## 2023-05-10 DIAGNOSIS — Z1151 Encounter for screening for human papillomavirus (HPV): Secondary | ICD-10-CM | POA: Diagnosis not present

## 2023-05-10 DIAGNOSIS — N3281 Overactive bladder: Secondary | ICD-10-CM | POA: Diagnosis not present

## 2023-05-10 DIAGNOSIS — Z124 Encounter for screening for malignant neoplasm of cervix: Secondary | ICD-10-CM

## 2023-05-10 MED ORDER — OXYBUTYNIN CHLORIDE ER 5 MG PO TB24: ORAL_TABLET | ORAL | 3 refills | Status: DC

## 2023-05-10 NOTE — Telephone Encounter (Signed)
Pharmacy asking for clarification  THE ER TAB IS ONCE A DAY DOSING, DID YOU WANT THE PLAIN WHICH IS TID?

## 2023-05-11 ENCOUNTER — Other Ambulatory Visit: Payer: Self-pay | Admitting: Obstetrics and Gynecology

## 2023-05-11 MED ORDER — OXYBUTYNIN CHLORIDE ER 15 MG PO TB24: 15.0000 mg | ORAL_TABLET | Freq: Every day | ORAL | 11 refills | Status: DC

## 2023-05-11 NOTE — Telephone Encounter (Signed)
Routing to the [employer]

## 2023-05-11 NOTE — Progress Notes (Signed)
New Ditropan XL 15 mg dosage sent in to pharmacy.

## 2023-05-12 ENCOUNTER — Other Ambulatory Visit: Payer: Self-pay | Admitting: Obstetrics and Gynecology

## 2023-05-12 DIAGNOSIS — Z1231 Encounter for screening mammogram for malignant neoplasm of breast: Secondary | ICD-10-CM

## 2023-05-12 LAB — CYTOLOGY - PAP
Comment: NEGATIVE
Diagnosis: NEGATIVE
High risk HPV: NEGATIVE

## 2023-05-14 NOTE — Patient Instructions (Signed)

## 2023-05-17 NOTE — Telephone Encounter (Signed)
LVM for pt to call back with questions. Explained dosage change in VM

## 2023-05-20 ENCOUNTER — Ambulatory Visit: Payer: Medicare HMO

## 2023-05-29 IMAGING — MG MM DIGITAL SCREENING BILAT W/ TOMO AND CAD
8 of 15 series · 8 of 40 positions shown · non-contrast
Comparison: Previous exam(s).

ACR Breast Density Category a: The breast tissue is almost entirely
fatty.

CLINICAL DATA: Screening.

EXAM:
DIGITAL SCREENING BILATERAL MAMMOGRAM WITH TOMOSYNTHESIS AND CAD
TECHNIQUE: Bilateral screening digital craniocaudal and mediolateral oblique
mammograms were obtained. Bilateral screening digital breast
tomosynthesis was performed. The images were evaluated with
computer-aided detection.

[L MLO synth-2D (1 of 2)]
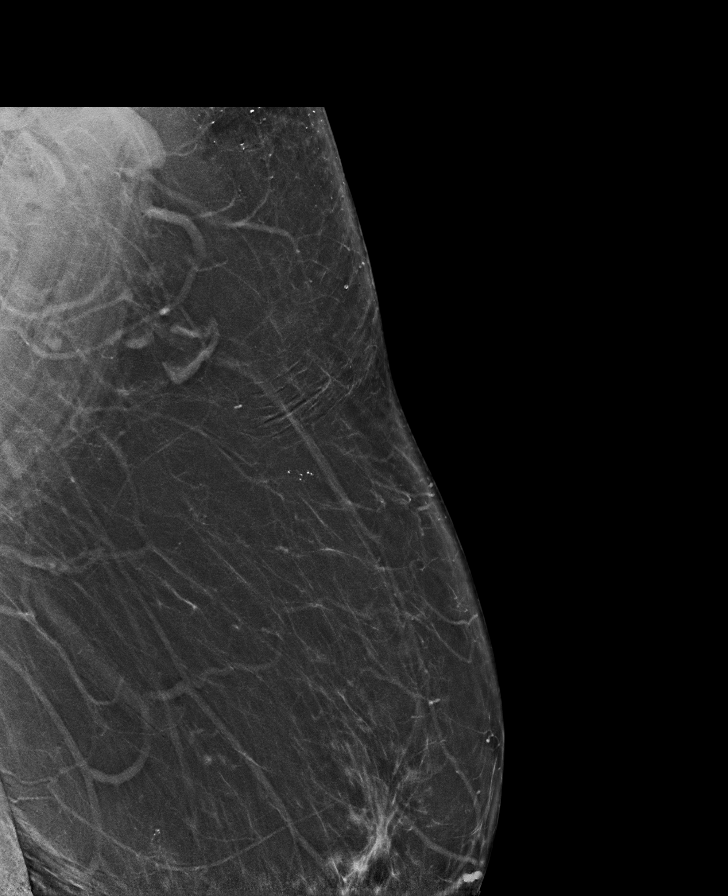

[L CC synth-2D (1 of 2)]
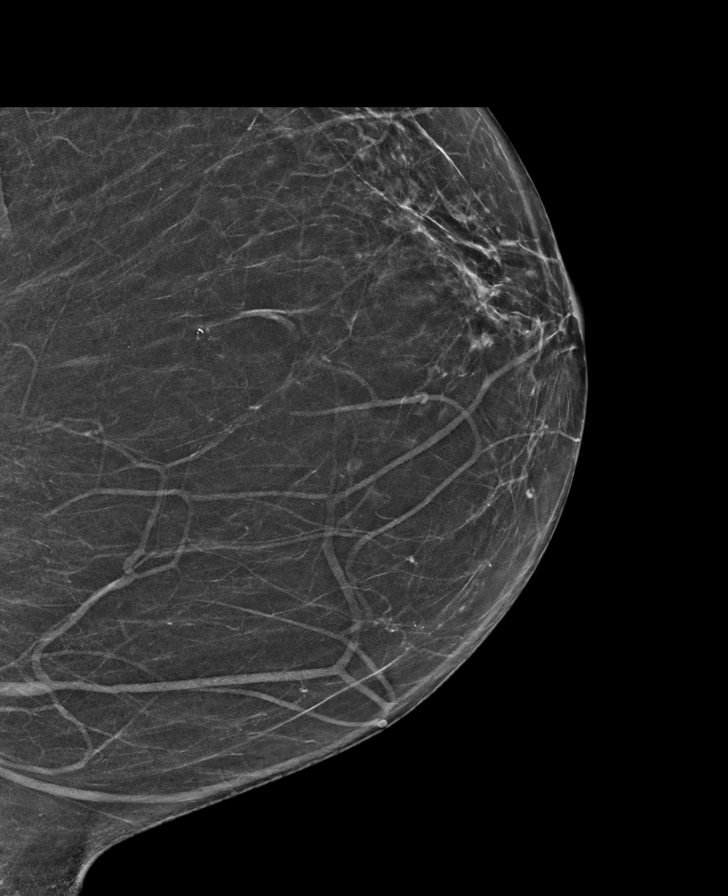

[L MLO synth-2D (2 of 2)]
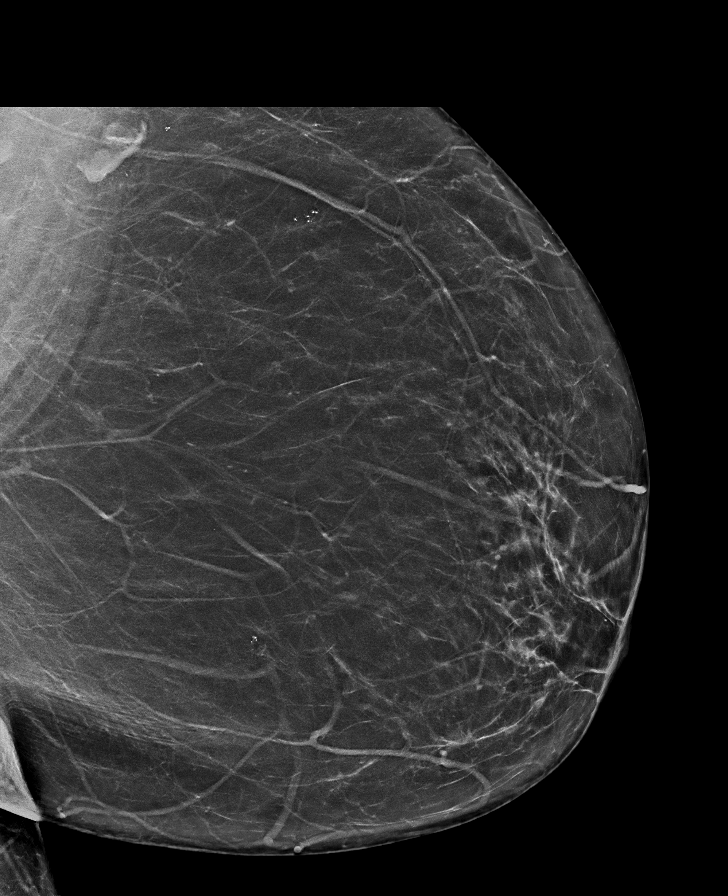

[L CC synth-2D (2 of 2)]
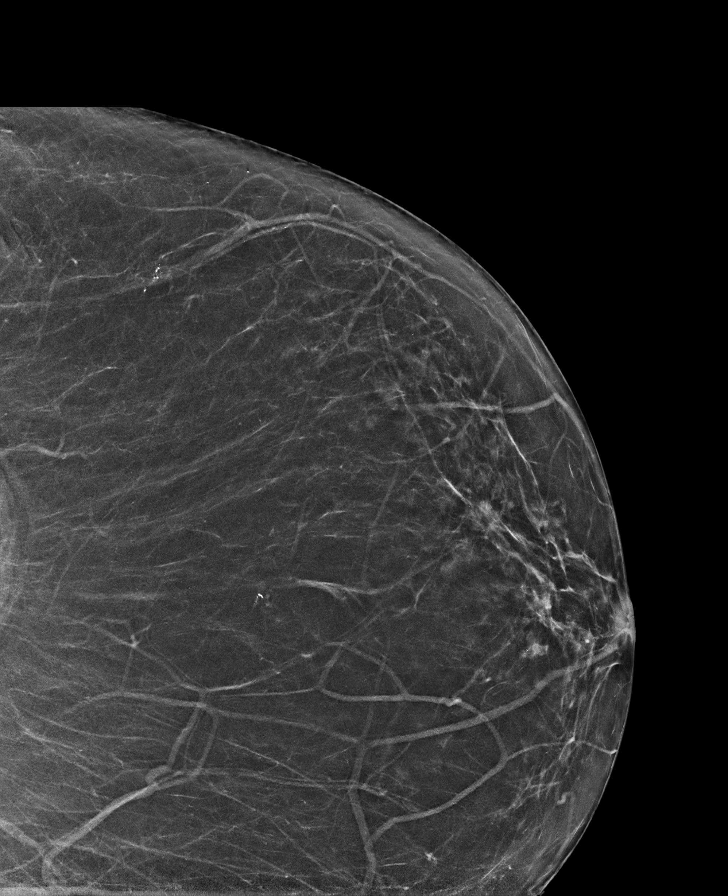

[R CC synth-2D]
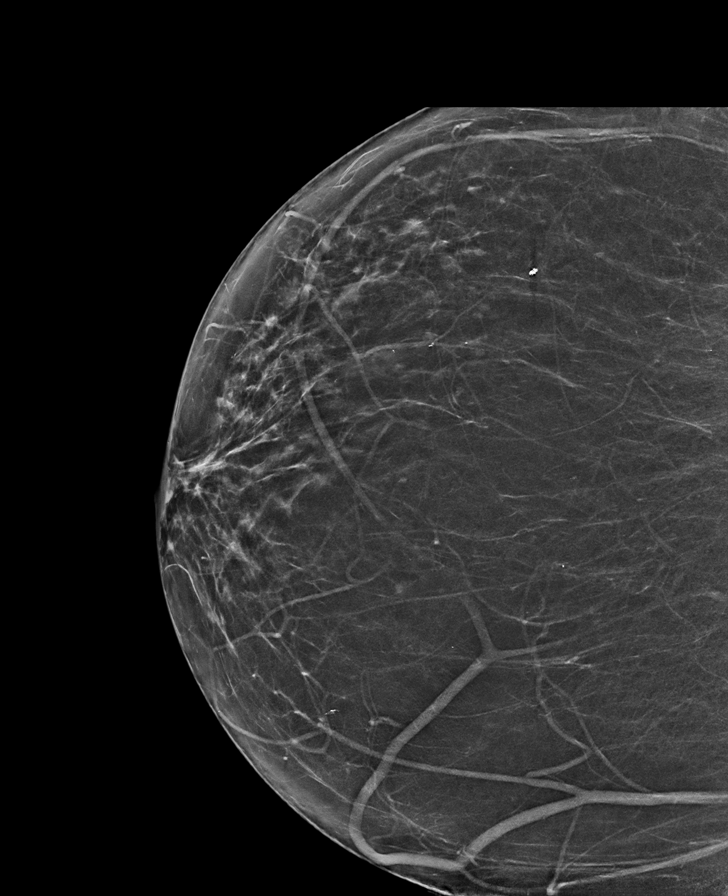

[R MLO synth-2D (1 of 2)]
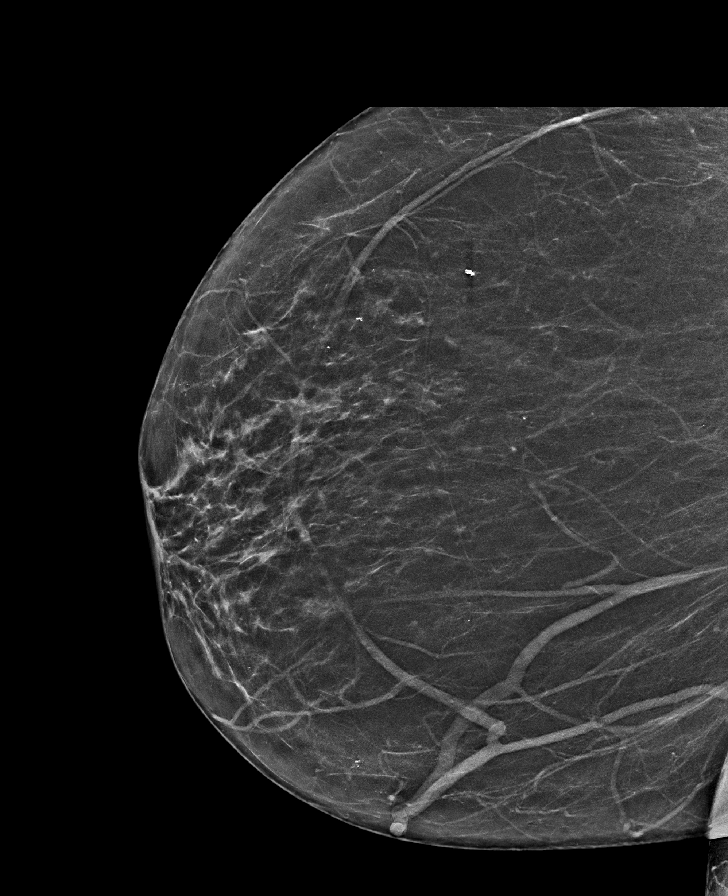

[R MLO synth-2D (2 of 2)]
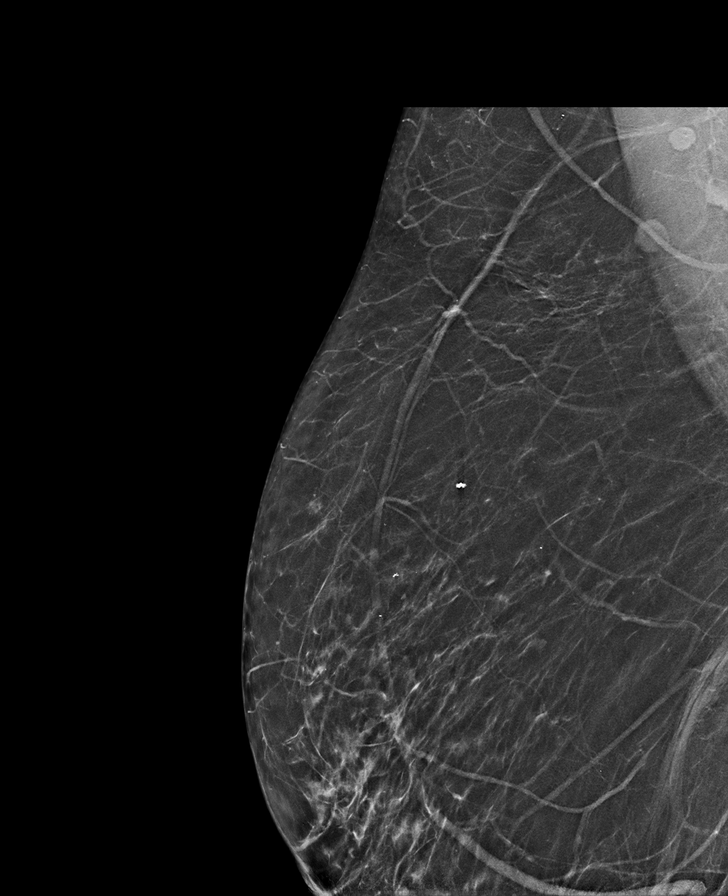

[R MLO tomo · tomo slice 51/75.0]
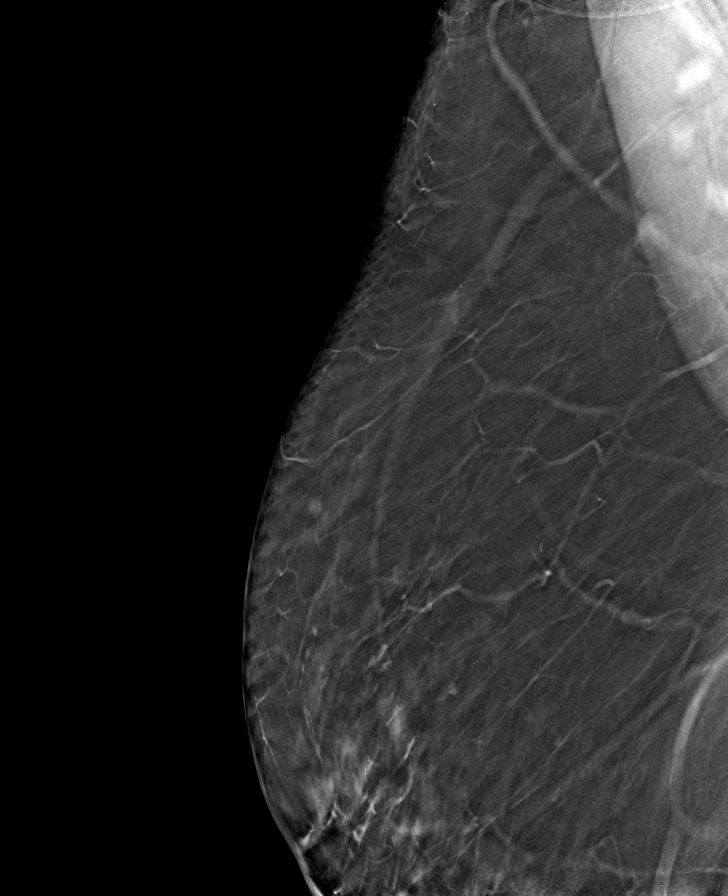

[8 of 40 positions shown; findings below may reference images not displayed]

FINDINGS: There are no findings suspicious for malignancy.
IMPRESSION: No mammographic evidence of malignancy. A result letter of this
screening mammogram will be mailed directly to the patient.

RECOMMENDATION:
Screening mammogram in one year. (Code:0E-3-N98)

BI-RADS CATEGORY  1: Negative.

## 2023-06-16 DIAGNOSIS — Z1231 Encounter for screening mammogram for malignant neoplasm of breast: Secondary | ICD-10-CM

## 2023-06-17 ENCOUNTER — Ambulatory Visit
Admission: RE | Admit: 2023-06-17 | Discharge: 2023-06-17 | Disposition: A | Discharge status: Home / Self Care | Payer: Medicare HMO | Source: Ambulatory Visit | Attending: Obstetrics and Gynecology | Admitting: Obstetrics and Gynecology

## 2023-06-17 DIAGNOSIS — Z1231 Encounter for screening mammogram for malignant neoplasm of breast: Secondary | ICD-10-CM

## 2024-02-17 ENCOUNTER — Other Ambulatory Visit: Payer: Self-pay | Admitting: Family Medicine

## 2024-02-17 DIAGNOSIS — Z1231 Encounter for screening mammogram for malignant neoplasm of breast: Secondary | ICD-10-CM

## 2024-05-15 ENCOUNTER — Telehealth: Payer: Self-pay | Admitting: Obstetrics and Gynecology

## 2024-05-15 ENCOUNTER — Encounter: Payer: Self-pay | Admitting: Obstetrics and Gynecology

## 2024-05-15 ENCOUNTER — Ambulatory Visit: Payer: Medicare HMO | Admitting: Obstetrics and Gynecology

## 2024-05-15 VITALS — BP 124/76 | HR 67

## 2024-05-15 DIAGNOSIS — N3281 Overactive bladder: Secondary | ICD-10-CM

## 2024-05-15 DIAGNOSIS — Z5181 Encounter for therapeutic drug level monitoring: Secondary | ICD-10-CM | POA: Diagnosis not present

## 2024-05-15 MED ORDER — GEMTESA 75 MG PO TABS: 75.0000 mg | ORAL_TABLET | Freq: Every day | ORAL | 2 refills | Status: DC

## 2024-05-15 NOTE — Patient Instructions (Signed)
Vibegron Tablets What is this medication? VIBEGRON (vye BEG ron) treats symptoms of an overactive bladder, such as loss of bladder control or frequent need to urinate. It works by relaxing muscles in the bladder. It belongs to a group of medications called antispasmodics. This medicine may be used for other purposes; ask your health care provider or pharmacist if you have questions. COMMON BRAND NAME(S): GEMTESA What should I tell my care team before I take this medication? They need to know if you have any of these conditions: Kidney disease Liver disease Prostate disease Trouble passing urine An unusual or allergic reaction to vibegron, other medications, foods, dyes, or preservatives Pregnant or trying to get pregnant Breast-feeding How should I use this medication? Take this medication by mouth with water. Take it as directed on the prescription label at the same time every day. Swallow the tablets whole. You may crush the tablet and mix with 1 tablespoon (15 mL) of applesauce. Swallow the medication and applesauce right away. Keep taking it unless your care team tells you to stop. You can take it with or without food. If it upsets your stomach, take it with food. Talk to your care team about the use of this medication in children. Special care may be needed. Overdosage: If you think you have taken too much of this medicine contact a poison control center or emergency room at once. NOTE: This medicine is only for you. Do not share this medicine with others. What if I miss a dose? If you miss a dose, take it as soon as you can. If it is almost time for your next dose, take only that dose. Do not take double or extra doses. What may interact with this medication? This medication may interact with the following: Digoxin This list may not describe all possible interactions. Give your health care provider a list of all the medicines, herbs, non-prescription drugs, or dietary supplements you  use. Also tell them if you smoke, drink alcohol, or use illegal drugs. Some items may interact with your medicine. What should I watch for while using this medication? Visit your care team for regular checks on your progress. Tell your care team if your symptoms do not start to get better or if they get worse. You may need to limit your intake of tea, coffee, caffeinated drinks, or alcohol. These drinks may make your symptoms worse. What side effects may I notice from receiving this medication? Side effects that you should report to your care team as soon as possible: Allergic reactions--skin rash, itching, hives, swelling of the face, lips, tongue, or throat Trouble passing urine Side effects that usually do not require medical attention (report to your care team if they continue or are bothersome): Diarrhea Headache Nausea Runny or stuffy nose Sore throat This list may not describe all possible side effects. Call your doctor for medical advice about side effects. You may report side effects to FDA at 1-800-FDA-1088. Where should I keep my medication? Keep out of the reach of children and pets. Store at room temperature between 20 and 25 degrees C (68 and 77 degrees F). Get rid of any unused medication after the expiration date. To get rid of medications that are no longer needed or have expired: Take the medication to a medication take-back program. Check with your pharmacy or law enforcement to find a location. If you cannot return the medication, check the label or package insert to see if the medication should be thrown out in  the garbage or flushed down the toilet. If you are not sure, ask your care team. If it is safe to put it in the trash, empty the medication out of the container. Mix the medication with cat litter, dirt, coffee grounds, or other unwanted substance. Seal the mixture in a bag or container. Put it in the trash. NOTE: This sheet is a summary. It may not cover all possible  information. If you have questions about this medicine, talk to your doctor, pharmacist, or health care provider.  2024 Elsevier/Gold Standard (2021-07-07 00:00:00)

## 2024-05-15 NOTE — Telephone Encounter (Signed)
 Please start prior authorization for Gemtesa.   Patient has been on Ditropan  and Ditropan  XL and has not responded to the medications.

## 2024-05-15 NOTE — Progress Notes (Unsigned)
 GYNECOLOGY  VISIT   HPI: 73 y.o.   Single  African American female   [id number] with Patient's last menstrual period was 08/27/2009 (exact date).   here for: Follow up - oxybutynin       Takes Ditropan  15 XL for overactive bladder.   Takes Ditropan  once in the morning and two in the evening.     Has urge, frequency, and leakage.   Daytime frequency every 2.5 - 3 hours.    Up once a night to void.  Pad can be wet.  States she is not certain if she is getting benefit from taking the medication.   Dry mouth.   No constipation.   Able to void.   Declines return to pelvic floor therapy.     Has arthritis and sees ortho.    GYNECOLOGIC HISTORY: Patient's last menstrual period was 08/27/2009 (exact date). Contraception:  PMP Menopausal hormone therapy:  n/a Last 2 paps:  05/10/23 neg HR HPV neg, 03/21/20 neg  History of abnormal Pap or positive HPV:  no Mammogram:  06/17/23 Breast Density Cat A, BIRADS Cat 1 neg         OB History     Gravida  4   Para  2   Term  1   Preterm  1   AB  2   Living  1      SAB  0   IAB  1   Ectopic  1   Multiple  0   Live Births  1              Patient Active Problem List   Diagnosis Date Noted   Mixed incontinence 05/11/2022   Internal hemorrhoids 08/07/2009   DYSLIPIDEMIA 03/26/2009   Essential hypertension 03/26/2009   IRON DEFICIENCY ANEMIA, HX OF 03/26/2009   History of colonic polyps 03/26/2009    Past Medical History:  Diagnosis Date   Anemia    Diabetes mellitus without complication (HCC)    Elevated cholesterol    Hemorrhoids    Hypertension    chronic   Menorrhagia 2008   6mon Lupron Tx completed 09/2006    Past Surgical History:  Procedure Laterality Date   ECTOPIC PREGNANCY SURGERY  1994   Bilat Salping   ENDOMETRIAL BIOPSY  04/2006    Current Outpatient Medications  Medication Sig Dispense Refill   atenolol (TENORMIN) 100 MG tablet Take 100 mg by mouth daily.       glipiZIDE  (GLUCOTROL XL) 10 MG 24 hr tablet Take 10 mg by mouth daily.     loratadine (CLARITIN) 10 MG tablet Take 10 mg by mouth daily as needed.  0   Multiple Vitamin (MULTIVITAMIN) tablet Take 1 tablet by mouth daily.     oxybutynin  (DITROPAN  XL) 15 MG 24 hr tablet Take 1 tablet (15 mg total) by mouth at bedtime. 30 tablet 11   simvastatin (ZOCOR) 20 MG tablet Take 20 mg by mouth at bedtime.       telmisartan-hydrochlorothiazide (MICARDIS HCT) 80-25 MG per tablet Take 1 tablet by mouth daily.       Vitamin D, Ergocalciferol, (DRISDOL) 1.25 MG (50000 UNIT) CAPS capsule Take 50,000 Units by mouth once a week.     No current facility-administered medications for this visit.     ALLERGIES: Patient has no known allergies.  Family History  Problem Relation Age of Onset   Hypertension Mother    Hypertension Sister    Hypertension Maternal Grandmother    Alcoholism  Father    Colon cancer Neg Hx    Esophageal cancer Neg Hx    Rectal cancer Neg Hx    Stomach cancer Neg Hx    Breast cancer Neg Hx     Social History   Socioeconomic History   Marital status: Single    Spouse name: Not on file   Number of children: 1   Years of education: Not on file   Highest education level: Not on file  Occupational History   Not on file  Tobacco Use   Smoking status: Never   Smokeless tobacco: Never  Vaping Use   Vaping status: Never Used  Substance and Sexual Activity   Alcohol use: No   Drug use: No   Sexual activity: Not Currently    Partners: Male    Birth control/protection: Post-menopausal    Comment: less than 5, after 18, no STD for sure, no abnormal pap, no DES exposure  Other Topics Concern   Not on file  Social History Narrative   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   PHYSICAL EXAMINATION:   BP 124/76 (BP Location: Left Arm, Patient Position: Sitting)   Pulse 67   LMP 08/27/2009 (Exact Date)   SpO2 98%     General appearance: alert, cooperative and appears stated age   Pelvic: External genitalia:  no lesions              Urethra:  normal appearing urethra with no masses, tenderness or lesions              Bartholins and Skenes: normal                 Vagina: normal appearing vagina with normal color and discharge, no lesions              Cervix: no lesions                Bimanual Exam:  Uterus:  normal size, contour, position, consistency, mobility, non-tender              Adnexa: no mass, fullness, tenderness      Chaperone was present for exam:  Clotilda DEL, CMA  ASSESSMENT:  Overactive bladder.  Not improved with Oxybutynin  and Oxybutynin  XL.   Encounter for medication monitoring.   PLAN:  Stop oxybutynin .  Start Gemtesa daily.   Potential side effects reviewed.  Will start prior authorization.  Bladder irritants discussed.  We reviewed PTNS and Botox therapy if medical treatment is not successful. FU in 7 weeks.   Breast and pelvic exam in 1 year.   30 min  total time was spent for this patient encounter, including preparation, face-to-face counseling with the patient, coordination of care, and documentation of the encounter.

## 2024-05-16 NOTE — Telephone Encounter (Signed)
 PA sent to plan for Gemtesa. CoverMyMeds states  The patient currently has access to the requested medication and a Prior Authorization is not needed for the patient/medication.   Called patient and left detailed message (OK per DPR) with this information and told her to call us  if she has any additional questions or concerns.   (Key: [id number])

## 2024-05-16 NOTE — Telephone Encounter (Signed)
 Routing to Jada for PA

## 2024-05-22 NOTE — Telephone Encounter (Signed)
 Patient left detailed message on triage voicemail. States New medication for OAB will cost $173 per month, would like to go back on Oxybutynin . Patient states she was not on the extended release.   Per review of 05/15/24 OV notes, OAB not improved on Oxybutynin , patient was instructed to stop.   Routing to Dr. Nikki to review and advise,

## 2024-05-22 NOTE — Telephone Encounter (Signed)
 Spoke with patient, advised per Dr. Nikki. Patient states she would prefer not to switch. States she would like to continue on Oxybutynin . Patient states she was not on the extended release as originally thought and discussed. Patient states if side effects of Gemtesa  and Myrbetriq are the same, she would prefer not to switch.   Advised patient I will update Dr. Nikki and our office will f/u with recommendations. Patient agreeable.

## 2024-05-22 NOTE — Telephone Encounter (Signed)
 Discontinue Gemtesa .   Patient can try Myrbetriq 25 mg daily, dispense #30, RF 1. Myrbetriq is in the same class of medication as Gemtesa .  This has potential for increasing blood pressure as can the Gemtesa .   She can try Myrbetriq and do a recheck in about 6 - 7 weeks as planned.

## 2024-05-23 ENCOUNTER — Other Ambulatory Visit: Payer: Self-pay | Admitting: Obstetrics and Gynecology

## 2024-05-23 MED ORDER — OXYBUTYNIN CHLORIDE 5 MG PO TABS: 5.0000 mg | ORAL_TABLET | Freq: Three times a day (TID) | ORAL | 11 refills | Status: AC

## 2024-05-23 NOTE — Telephone Encounter (Signed)
 Patient notified

## 2024-05-23 NOTE — Telephone Encounter (Signed)
 Prescription for oxybutynin  5 mg po tid sent to her pharmacy.  #90, RF 11.

## 2024-07-04 ENCOUNTER — Ambulatory Visit

## 2024-07-05 ENCOUNTER — Ambulatory Visit: Admitting: Obstetrics and Gynecology

## 2024-07-13 ENCOUNTER — Ambulatory Visit
Admission: RE | Admit: 2024-07-13 | Discharge: 2024-07-13 | Disposition: A | Source: Ambulatory Visit | Attending: Family Medicine | Admitting: Family Medicine

## 2024-07-13 DIAGNOSIS — Z1231 Encounter for screening mammogram for malignant neoplasm of breast: Secondary | ICD-10-CM

## 2025-05-22 ENCOUNTER — Encounter: Admitting: Obstetrics and Gynecology
